# Patient Record
Sex: Female | Born: 1960 | Race: White | Hispanic: No | Marital: Married | Smoking: Former smoker
Health system: Southern US, Community
[De-identification: ages and names within clinical notes are randomized; demographics above are authoritative.]

## PROBLEM LIST (undated history)

## (undated) DIAGNOSIS — E538 Deficiency of other specified B group vitamins: Secondary | ICD-10-CM

## (undated) DIAGNOSIS — M25569 Pain in unspecified knee: Secondary | ICD-10-CM

## (undated) DIAGNOSIS — K219 Gastro-esophageal reflux disease without esophagitis: Secondary | ICD-10-CM

## (undated) DIAGNOSIS — M2550 Pain in unspecified joint: Secondary | ICD-10-CM

## (undated) DIAGNOSIS — C50919 Malignant neoplasm of unspecified site of unspecified female breast: Secondary | ICD-10-CM

## (undated) DIAGNOSIS — R5383 Other fatigue: Secondary | ICD-10-CM

## (undated) DIAGNOSIS — F329 Major depressive disorder, single episode, unspecified: Secondary | ICD-10-CM

## (undated) DIAGNOSIS — G43909 Migraine, unspecified, not intractable, without status migrainosus: Secondary | ICD-10-CM

## (undated) DIAGNOSIS — F419 Anxiety disorder, unspecified: Secondary | ICD-10-CM

## (undated) DIAGNOSIS — IMO0001 Reserved for inherently not codable concepts without codable children: Secondary | ICD-10-CM

## (undated) DIAGNOSIS — E039 Hypothyroidism, unspecified: Secondary | ICD-10-CM

## (undated) DIAGNOSIS — E079 Disorder of thyroid, unspecified: Secondary | ICD-10-CM

## (undated) DIAGNOSIS — Z8719 Personal history of other diseases of the digestive system: Secondary | ICD-10-CM

## (undated) DIAGNOSIS — M62830 Muscle spasm of back: Secondary | ICD-10-CM

## (undated) DIAGNOSIS — M545 Low back pain, unspecified: Secondary | ICD-10-CM

## (undated) DIAGNOSIS — D229 Melanocytic nevi, unspecified: Secondary | ICD-10-CM

## (undated) DIAGNOSIS — T148XXA Other injury of unspecified body region, initial encounter: Secondary | ICD-10-CM

## (undated) DIAGNOSIS — Z973 Presence of spectacles and contact lenses: Secondary | ICD-10-CM

## (undated) DIAGNOSIS — R3989 Other symptoms and signs involving the genitourinary system: Secondary | ICD-10-CM

## (undated) DIAGNOSIS — D649 Anemia, unspecified: Secondary | ICD-10-CM

## (undated) DIAGNOSIS — K121 Other forms of stomatitis: Secondary | ICD-10-CM

## (undated) DIAGNOSIS — F900 Attention-deficit hyperactivity disorder, predominantly inattentive type: Secondary | ICD-10-CM

## (undated) DIAGNOSIS — E876 Hypokalemia: Secondary | ICD-10-CM

## (undated) DIAGNOSIS — K5090 Crohn's disease, unspecified, without complications: Secondary | ICD-10-CM

## (undated) HISTORY — DX: Melanocytic nevi, unspecified: D22.9

## (undated) HISTORY — DX: Anemia, unspecified: D64.9

## (undated) HISTORY — DX: Malignant neoplasm of unspecified site of unspecified female breast: C50.919

## (undated) HISTORY — DX: Low back pain, unspecified: M54.50

## (undated) HISTORY — DX: Pain in unspecified knee: M25.569

## (undated) HISTORY — DX: Low back pain: M54.5

## (undated) HISTORY — DX: Muscle spasm of back: M62.830

## (undated) HISTORY — DX: Presence of spectacles and contact lenses: Z97.3

## (undated) HISTORY — DX: Reserved for inherently not codable concepts without codable children: IMO0001

## (undated) HISTORY — DX: Other fatigue: R53.83

## (undated) HISTORY — DX: Hypothyroidism, unspecified: E03.9

## (undated) HISTORY — DX: Hypokalemia: E87.6

## (undated) HISTORY — DX: Other forms of stomatitis: K12.1

## (undated) HISTORY — DX: Other symptoms and signs involving the genitourinary system: R39.89

## (undated) HISTORY — DX: Pain in unspecified joint: M25.50

## (undated) HISTORY — DX: Gastro-esophageal reflux disease without esophagitis: K21.9

## (undated) HISTORY — DX: Deficiency of other specified B group vitamins: E53.8

## (undated) HISTORY — DX: Attention-deficit hyperactivity disorder, predominantly inattentive type: F90.0

## (undated) HISTORY — DX: Other injury of unspecified body region, initial encounter: T14.8XXA

## (undated) HISTORY — DX: Disorder of thyroid, unspecified: E07.9

## (undated) HISTORY — DX: Crohn's disease, unspecified, without complications: K50.90

## (undated) HISTORY — DX: Migraine, unspecified, not intractable, without status migrainosus: G43.909

## (undated) HISTORY — DX: Anxiety disorder, unspecified: F41.9

## (undated) HISTORY — DX: Personal history of other diseases of the digestive system: Z87.19

## (undated) HISTORY — PX: COLON RESECTION: SHX5231

## (undated) HISTORY — DX: Major depressive disorder, single episode, unspecified: F32.9

---

## 1997-04-30 ENCOUNTER — Ambulatory Visit (HOSPITAL_COMMUNITY): Admission: RE | Admit: 1997-04-30 | Discharge: 1997-04-30 | Payer: Self-pay | Admitting: Obstetrics and Gynecology

## 1997-06-06 ENCOUNTER — Inpatient Hospital Stay (HOSPITAL_COMMUNITY): Admission: RE | Admit: 1997-06-06 | Discharge: 1997-06-09 | Payer: Self-pay | Admitting: Surgery

## 1997-10-27 ENCOUNTER — Inpatient Hospital Stay (HOSPITAL_COMMUNITY): Admission: RE | Admit: 1997-10-27 | Discharge: 1997-10-30 | Payer: Self-pay | Admitting: Surgery

## 1997-11-11 ENCOUNTER — Ambulatory Visit: Admission: RE | Admit: 1997-11-11 | Discharge: 1997-11-11 | Payer: Self-pay | Admitting: Gynecology

## 1997-12-02 ENCOUNTER — Encounter (HOSPITAL_COMMUNITY): Admission: RE | Admit: 1997-12-02 | Discharge: 1998-03-02 | Payer: Self-pay | Admitting: Gastroenterology

## 1998-01-21 ENCOUNTER — Ambulatory Visit: Admission: RE | Admit: 1998-01-21 | Discharge: 1998-01-21 | Payer: Self-pay | Admitting: Gynecology

## 1998-01-23 ENCOUNTER — Ambulatory Visit (HOSPITAL_COMMUNITY): Admission: RE | Admit: 1998-01-23 | Discharge: 1998-01-23 | Payer: Self-pay | Admitting: Gastroenterology

## 1998-01-27 ENCOUNTER — Observation Stay (HOSPITAL_COMMUNITY): Admission: RE | Admit: 1998-01-27 | Discharge: 1998-01-28 | Payer: Self-pay | Admitting: Gynecology

## 1998-01-27 ENCOUNTER — Encounter: Payer: Self-pay | Admitting: Gynecology

## 1998-02-10 ENCOUNTER — Ambulatory Visit: Admission: RE | Admit: 1998-02-10 | Discharge: 1998-02-10 | Payer: Self-pay | Admitting: Gynecology

## 1998-03-04 ENCOUNTER — Ambulatory Visit: Admission: RE | Admit: 1998-03-04 | Discharge: 1998-03-04 | Payer: Self-pay | Admitting: Gynecology

## 1998-03-31 ENCOUNTER — Ambulatory Visit: Admission: RE | Admit: 1998-03-31 | Discharge: 1998-03-31 | Payer: Self-pay | Admitting: Gynecology

## 1998-06-03 ENCOUNTER — Ambulatory Visit: Admission: RE | Admit: 1998-06-03 | Discharge: 1998-06-03 | Payer: Self-pay | Admitting: Gynecology

## 1998-11-04 ENCOUNTER — Ambulatory Visit: Admission: RE | Admit: 1998-11-04 | Discharge: 1998-11-04 | Payer: Self-pay | Admitting: Gynecology

## 1998-11-05 ENCOUNTER — Other Ambulatory Visit: Admission: RE | Admit: 1998-11-05 | Discharge: 1998-11-05 | Payer: Self-pay | Admitting: Gynecology

## 1999-04-06 ENCOUNTER — Ambulatory Visit: Admission: RE | Admit: 1999-04-06 | Discharge: 1999-04-06 | Payer: Self-pay | Admitting: Gynecology

## 2000-08-22 ENCOUNTER — Ambulatory Visit: Admission: RE | Admit: 2000-08-22 | Discharge: 2000-08-22 | Payer: Self-pay | Admitting: Gynecology

## 2000-09-19 ENCOUNTER — Ambulatory Visit (HOSPITAL_COMMUNITY): Admission: RE | Admit: 2000-09-19 | Discharge: 2000-09-19 | Payer: Self-pay | Admitting: Gynecology

## 2000-09-19 ENCOUNTER — Encounter: Payer: Self-pay | Admitting: Gynecology

## 2000-10-04 ENCOUNTER — Encounter (HOSPITAL_COMMUNITY): Admission: RE | Admit: 2000-10-04 | Discharge: 2001-01-02 | Payer: Self-pay | Admitting: Gastroenterology

## 2001-01-16 ENCOUNTER — Encounter: Payer: Self-pay | Admitting: Gastroenterology

## 2001-01-16 ENCOUNTER — Encounter: Admission: RE | Admit: 2001-01-16 | Discharge: 2001-01-16 | Payer: Self-pay | Admitting: Gastroenterology

## 2001-03-01 ENCOUNTER — Ambulatory Visit (HOSPITAL_COMMUNITY): Admission: RE | Admit: 2001-03-01 | Discharge: 2001-03-01 | Payer: Self-pay | Admitting: Gastroenterology

## 2001-03-01 ENCOUNTER — Encounter (INDEPENDENT_AMBULATORY_CARE_PROVIDER_SITE_OTHER): Payer: Self-pay

## 2001-03-07 ENCOUNTER — Ambulatory Visit: Admission: RE | Admit: 2001-03-07 | Discharge: 2001-03-07 | Payer: Self-pay | Admitting: Gynecology

## 2001-05-30 ENCOUNTER — Encounter: Admission: RE | Admit: 2001-05-30 | Discharge: 2001-05-30 | Payer: Self-pay | Admitting: Obstetrics and Gynecology

## 2001-05-30 ENCOUNTER — Encounter: Payer: Self-pay | Admitting: Obstetrics and Gynecology

## 2003-09-03 ENCOUNTER — Ambulatory Visit (HOSPITAL_COMMUNITY): Admission: RE | Admit: 2003-09-03 | Discharge: 2003-09-03 | Payer: Self-pay | Admitting: Gastroenterology

## 2003-09-03 ENCOUNTER — Encounter (INDEPENDENT_AMBULATORY_CARE_PROVIDER_SITE_OTHER): Payer: Self-pay | Admitting: Specialist

## 2003-09-24 ENCOUNTER — Encounter: Admission: RE | Admit: 2003-09-24 | Discharge: 2003-09-24 | Payer: Self-pay | Admitting: Gastroenterology

## 2004-08-31 ENCOUNTER — Other Ambulatory Visit: Admission: RE | Admit: 2004-08-31 | Discharge: 2004-08-31 | Payer: Self-pay | Admitting: Obstetrics and Gynecology

## 2004-10-11 ENCOUNTER — Encounter: Admission: RE | Admit: 2004-10-11 | Discharge: 2004-10-11 | Payer: Self-pay | Admitting: Gastroenterology

## 2005-06-17 ENCOUNTER — Encounter: Admission: RE | Admit: 2005-06-17 | Discharge: 2005-06-17 | Payer: Self-pay | Admitting: Gastroenterology

## 2005-07-01 ENCOUNTER — Encounter (HOSPITAL_COMMUNITY): Admission: RE | Admit: 2005-07-01 | Discharge: 2005-09-29 | Payer: Self-pay | Admitting: Gastroenterology

## 2005-08-12 ENCOUNTER — Encounter: Admission: RE | Admit: 2005-08-12 | Discharge: 2005-08-12 | Payer: Self-pay | Admitting: Gastroenterology

## 2005-10-07 ENCOUNTER — Encounter: Admission: RE | Admit: 2005-10-07 | Discharge: 2005-10-07 | Payer: Self-pay | Admitting: Gastroenterology

## 2008-06-05 ENCOUNTER — Encounter: Admission: RE | Admit: 2008-06-05 | Discharge: 2008-06-05 | Payer: Self-pay | Admitting: Family Medicine

## 2008-07-09 ENCOUNTER — Inpatient Hospital Stay (HOSPITAL_COMMUNITY): Admission: EM | Admit: 2008-07-09 | Discharge: 2008-07-12 | Payer: Self-pay | Admitting: Emergency Medicine

## 2008-07-21 ENCOUNTER — Ambulatory Visit (HOSPITAL_COMMUNITY): Admission: RE | Admit: 2008-07-21 | Discharge: 2008-07-21 | Payer: Self-pay | Admitting: Neurosurgery

## 2008-08-22 ENCOUNTER — Encounter: Admission: RE | Admit: 2008-08-22 | Discharge: 2008-08-22 | Payer: Self-pay | Admitting: Family Medicine

## 2009-04-29 ENCOUNTER — Ambulatory Visit: Admission: RE | Admit: 2009-04-29 | Discharge: 2009-04-29 | Payer: Self-pay | Admitting: Gynecology

## 2009-05-12 ENCOUNTER — Ambulatory Visit (HOSPITAL_COMMUNITY): Admission: RE | Admit: 2009-05-12 | Discharge: 2009-05-12 | Payer: Self-pay | Admitting: Gynecology

## 2009-05-26 ENCOUNTER — Encounter: Admission: RE | Admit: 2009-05-26 | Discharge: 2009-05-26 | Payer: Self-pay | Admitting: Gynecology

## 2009-07-03 ENCOUNTER — Ambulatory Visit: Admission: RE | Admit: 2009-07-03 | Discharge: 2009-07-03 | Payer: Self-pay | Admitting: Gynecology

## 2009-08-04 ENCOUNTER — Ambulatory Visit (HOSPITAL_COMMUNITY): Admission: RE | Admit: 2009-08-04 | Discharge: 2009-08-04 | Payer: Self-pay | Admitting: Gynecology

## 2009-08-11 ENCOUNTER — Ambulatory Visit: Admission: RE | Admit: 2009-08-11 | Discharge: 2009-08-11 | Payer: Self-pay | Admitting: Gynecology

## 2009-08-18 ENCOUNTER — Inpatient Hospital Stay (HOSPITAL_COMMUNITY): Admission: RE | Admit: 2009-08-18 | Discharge: 2009-08-23 | Payer: Self-pay | Admitting: Obstetrics & Gynecology

## 2009-08-26 ENCOUNTER — Ambulatory Visit: Admission: RE | Admit: 2009-08-26 | Discharge: 2009-08-26 | Payer: Self-pay | Admitting: Gynecology

## 2009-09-11 ENCOUNTER — Ambulatory Visit: Admission: RE | Admit: 2009-09-11 | Discharge: 2009-09-11 | Payer: Self-pay | Admitting: Gynecology

## 2010-01-01 ENCOUNTER — Encounter: Admission: RE | Admit: 2010-01-01 | Discharge: 2010-01-01 | Payer: Self-pay | Admitting: Obstetrics and Gynecology

## 2010-01-04 ENCOUNTER — Encounter: Admission: RE | Admit: 2010-01-04 | Discharge: 2010-01-04 | Payer: Self-pay | Admitting: Obstetrics and Gynecology

## 2010-01-05 ENCOUNTER — Ambulatory Visit: Payer: Self-pay | Admitting: Oncology

## 2010-01-06 ENCOUNTER — Ambulatory Visit: Payer: Self-pay | Admitting: Psychiatry

## 2010-01-06 LAB — CBC WITH DIFFERENTIAL/PLATELET
BASO%: 0.6 % (ref 0.0–2.0)
EOS%: 2.3 % (ref 0.0–7.0)
HCT: 36.8 % (ref 34.8–46.6)
LYMPH%: 18.5 % (ref 14.0–49.7)
MCH: 31.9 pg (ref 25.1–34.0)
MCHC: 33.6 g/dL (ref 31.5–36.0)
NEUT%: 72.5 % (ref 38.4–76.8)
RBC: 3.88 10*6/uL (ref 3.70–5.45)
lymph#: 1.4 10*3/uL (ref 0.9–3.3)

## 2010-01-06 LAB — COMPREHENSIVE METABOLIC PANEL
ALT: 14 U/L (ref 0–35)
AST: 16 U/L (ref 0–37)
Chloride: 109 mEq/L (ref 96–112)
Creatinine, Ser: 0.82 mg/dL (ref 0.40–1.20)
Sodium: 141 mEq/L (ref 135–145)
Total Bilirubin: 0.5 mg/dL (ref 0.3–1.2)
Total Protein: 5.7 g/dL — ABNORMAL LOW (ref 6.0–8.3)

## 2010-01-09 ENCOUNTER — Encounter: Admission: RE | Admit: 2010-01-09 | Discharge: 2010-01-09 | Payer: Self-pay | Admitting: Obstetrics and Gynecology

## 2010-01-12 ENCOUNTER — Ambulatory Visit
Admission: RE | Admit: 2010-01-12 | Discharge: 2010-02-10 | Payer: Self-pay | Source: Home / Self Care | Attending: Radiation Oncology | Admitting: Radiation Oncology

## 2010-01-12 ENCOUNTER — Ambulatory Visit (HOSPITAL_COMMUNITY)
Admission: RE | Admit: 2010-01-12 | Discharge: 2010-01-12 | Payer: Self-pay | Source: Home / Self Care | Admitting: Oncology

## 2010-01-13 ENCOUNTER — Ambulatory Visit: Payer: Self-pay | Admitting: Psychiatry

## 2010-01-21 ENCOUNTER — Ambulatory Visit: Payer: Self-pay | Admitting: Psychiatry

## 2010-01-26 ENCOUNTER — Ambulatory Visit (HOSPITAL_COMMUNITY)
Admission: RE | Admit: 2010-01-26 | Discharge: 2010-01-26 | Payer: Self-pay | Source: Home / Self Care | Attending: General Surgery | Admitting: General Surgery

## 2010-01-27 ENCOUNTER — Ambulatory Visit: Payer: Self-pay | Admitting: Psychiatry

## 2010-01-27 ENCOUNTER — Ambulatory Visit (HOSPITAL_COMMUNITY)
Admission: RE | Admit: 2010-01-27 | Discharge: 2010-01-27 | Payer: Self-pay | Source: Home / Self Care | Attending: Oncology | Admitting: Oncology

## 2010-01-27 ENCOUNTER — Encounter: Payer: Self-pay | Admitting: Oncology

## 2010-01-28 LAB — CBC WITH DIFFERENTIAL/PLATELET
BASO%: 0.3 % (ref 0.0–2.0)
HCT: 37.5 % (ref 34.8–46.6)
LYMPH%: 28.3 % (ref 14.0–49.7)
MCH: 32.4 pg (ref 25.1–34.0)
MCHC: 33.9 g/dL (ref 31.5–36.0)
MCV: 95.5 fL (ref 79.5–101.0)
MONO#: 0.4 10*3/uL (ref 0.1–0.9)
MONO%: 9.2 % (ref 0.0–14.0)
NEUT%: 60.1 % (ref 38.4–76.8)
Platelets: 182 10*3/uL (ref 145–400)
RBC: 3.93 10*6/uL (ref 3.70–5.45)
WBC: 4.3 10*3/uL (ref 3.9–10.3)

## 2010-01-28 LAB — COMPREHENSIVE METABOLIC PANEL
ALT: 29 U/L (ref 0–35)
AST: 25 U/L (ref 0–37)
Alkaline Phosphatase: 74 U/L (ref 39–117)
Creatinine, Ser: 0.59 mg/dL (ref 0.40–1.20)
Sodium: 142 mEq/L (ref 135–145)
Total Bilirubin: 0.2 mg/dL — ABNORMAL LOW (ref 0.3–1.2)
Total Protein: 6.1 g/dL (ref 6.0–8.3)

## 2010-02-03 ENCOUNTER — Ambulatory Visit: Payer: Self-pay | Admitting: Psychiatry

## 2010-02-04 ENCOUNTER — Ambulatory Visit: Payer: Self-pay | Admitting: Oncology

## 2010-02-04 LAB — COMPREHENSIVE METABOLIC PANEL
AST: 41 U/L — ABNORMAL HIGH (ref 0–37)
Albumin: 4.3 g/dL (ref 3.5–5.2)
Alkaline Phosphatase: 84 U/L (ref 39–117)
Potassium: 3.6 mEq/L (ref 3.5–5.3)
Sodium: 140 mEq/L (ref 135–145)
Total Bilirubin: 0.5 mg/dL (ref 0.3–1.2)
Total Protein: 7 g/dL (ref 6.0–8.3)

## 2010-02-04 LAB — CBC WITH DIFFERENTIAL/PLATELET
BASO%: 0.5 % (ref 0.0–2.0)
EOS%: 0.4 % (ref 0.0–7.0)
MCH: 31.7 pg (ref 25.1–34.0)
MCHC: 34.3 g/dL (ref 31.5–36.0)
MCV: 92.6 fL (ref 79.5–101.0)
MONO%: 4.8 % (ref 0.0–14.0)
RBC: 3.78 10*6/uL (ref 3.70–5.45)
RDW: 13 % (ref 11.2–14.5)
lymph#: 1.2 10*3/uL (ref 0.9–3.3)

## 2010-02-11 LAB — BASIC METABOLIC PANEL
BUN: 18 mg/dL (ref 6–23)
Chloride: 106 mEq/L (ref 96–112)
Creatinine, Ser: 0.96 mg/dL (ref 0.40–1.20)
Glucose, Bld: 98 mg/dL (ref 70–99)
Potassium: 3.2 mEq/L — ABNORMAL LOW (ref 3.5–5.3)

## 2010-02-11 LAB — CBC WITH DIFFERENTIAL/PLATELET
BASO%: 0.6 % (ref 0.0–2.0)
Eosinophils Absolute: 0 10*3/uL (ref 0.0–0.5)
LYMPH%: 29.7 % (ref 14.0–49.7)
MCHC: 34.1 g/dL (ref 31.5–36.0)
MCV: 91.3 fL (ref 79.5–101.0)
MONO%: 15.9 % — ABNORMAL HIGH (ref 0.0–14.0)
NEUT#: 1.8 10*3/uL (ref 1.5–6.5)
Platelets: 237 10*3/uL (ref 145–400)
RBC: 3.69 10*6/uL — ABNORMAL LOW (ref 3.70–5.45)
RDW: 12.9 % (ref 11.2–14.5)
WBC: 3.3 10*3/uL — ABNORMAL LOW (ref 3.9–10.3)
nRBC: 0 % (ref 0–0)

## 2010-02-14 HISTORY — PX: MASTECTOMY: SHX3

## 2010-02-17 ENCOUNTER — Inpatient Hospital Stay (HOSPITAL_COMMUNITY)
Admission: EM | Admit: 2010-02-17 | Discharge: 2010-02-20 | Payer: Self-pay | Source: Home / Self Care | Attending: Internal Medicine | Admitting: Internal Medicine

## 2010-02-17 LAB — COMPREHENSIVE METABOLIC PANEL
ALT: 18 U/L (ref 0–35)
AST: 16 U/L (ref 0–37)
Albumin: 3.8 g/dL (ref 3.5–5.2)
Alkaline Phosphatase: 82 U/L (ref 39–117)
BUN: 31 mg/dL — ABNORMAL HIGH (ref 6–23)
CO2: 22 mEq/L (ref 19–32)
Calcium: 9.3 mg/dL (ref 8.4–10.5)
Chloride: 98 mEq/L (ref 96–112)
Creatinine, Ser: 2.04 mg/dL — ABNORMAL HIGH (ref 0.4–1.2)
GFR calc Af Amer: 32 mL/min — ABNORMAL LOW (ref >60)
GFR calc non Af Amer: 26 mL/min — ABNORMAL LOW (ref >60)
Glucose, Bld: 122 mg/dL — ABNORMAL HIGH (ref 70–99)
Potassium: 3 mEq/L — ABNORMAL LOW (ref 3.5–5.1)
Sodium: 136 mEq/L (ref 135–145)
Total Bilirubin: 1.2 mg/dL (ref 0.3–1.2)
Total Protein: 7.8 g/dL (ref 6.0–8.3)

## 2010-02-17 LAB — LACTIC ACID, PLASMA: Lactic Acid, Venous: 1.4 mmol/L (ref 0.5–2.2)

## 2010-02-17 LAB — URINALYSIS, ROUTINE W REFLEX MICROSCOPIC
Hemoglobin, Urine: NEGATIVE
Nitrite: NEGATIVE
Protein, ur: 30 mg/dL — AB
Specific Gravity, Urine: 1.029 (ref 1.005–1.030)
Urine Glucose, Fasting: NEGATIVE mg/dL
Urobilinogen, UA: 0.2 mg/dL (ref 0.0–1.0)
pH: 6 (ref 5.0–8.0)

## 2010-02-17 LAB — LIPASE, BLOOD: Lipase: 17 U/L (ref 11–59)

## 2010-02-17 LAB — URINE MICROSCOPIC-ADD ON

## 2010-02-17 LAB — CBC
HCT: 40.7 % (ref 36.0–46.0)
Hemoglobin: 14.1 g/dL (ref 12.0–15.0)
MCH: 32.5 pg (ref 26.0–34.0)
MCHC: 34.6 g/dL (ref 30.0–36.0)
MCV: 93.8 fL (ref 78.0–100.0)
Platelets: 416 10*3/uL — ABNORMAL HIGH (ref 150–400)
RBC: 4.34 MIL/uL (ref 3.87–5.11)
RDW: 12.7 % (ref 11.5–15.5)
WBC: 7 10*3/uL (ref 4.0–10.5)

## 2010-02-17 LAB — DIFFERENTIAL
Basophils Absolute: 0.1 10*3/uL (ref 0.0–0.1)
Basophils Relative: 1 % (ref 0–1)
Eosinophils Absolute: 0 10*3/uL (ref 0.0–0.7)
Eosinophils Relative: 0 % (ref 0–5)
Lymphocytes Relative: 17 % (ref 12–46)
Lymphs Abs: 1.2 10*3/uL (ref 0.7–4.0)
Monocytes Absolute: 1.1 10*3/uL — ABNORMAL HIGH (ref 0.1–1.0)
Monocytes Relative: 15 % — ABNORMAL HIGH (ref 3–12)
Neutro Abs: 4.6 10*3/uL (ref 1.7–7.7)
Neutrophils Relative %: 67 % (ref 43–77)

## 2010-02-18 LAB — CBC
HCT: 31.6 % — ABNORMAL LOW (ref 36.0–46.0)
HCT: 31.3 % — ABNORMAL LOW (ref 36.0–46.0)
Hemoglobin: 10.5 g/dL — ABNORMAL LOW (ref 12.0–15.0)
Hemoglobin: 10.6 g/dL — ABNORMAL LOW (ref 12.0–15.0)
MCH: 31.4 pg (ref 26.0–34.0)
MCH: 31.8 pg (ref 26.0–34.0)
MCHC: 33.2 g/dL (ref 30.0–36.0)
MCHC: 33.9 g/dL (ref 30.0–36.0)
MCV: 94.6 fL (ref 78.0–100.0)
MCV: 94 fL (ref 78.0–100.0)
Platelets: 274 10*3/uL (ref 150–400)
Platelets: 282 10*3/uL (ref 150–400)
RBC: 3.34 MIL/uL — ABNORMAL LOW (ref 3.87–5.11)
RBC: 3.33 MIL/uL — ABNORMAL LOW (ref 3.87–5.11)
RDW: 12.7 % (ref 11.5–15.5)
RDW: 12.6 % (ref 11.5–15.5)
WBC: 4.9 10*3/uL (ref 4.0–10.5)
WBC: 4.3 10*3/uL (ref 4.0–10.5)

## 2010-02-18 LAB — BASIC METABOLIC PANEL
BUN: 26 mg/dL — ABNORMAL HIGH (ref 6–23)
CO2: 19 mEq/L (ref 19–32)
Calcium: 7.6 mg/dL — ABNORMAL LOW (ref 8.4–10.5)
Chloride: 109 mEq/L (ref 96–112)
Creatinine, Ser: 1.31 mg/dL — ABNORMAL HIGH (ref 0.4–1.2)
GFR calc Af Amer: 53 mL/min — ABNORMAL LOW (ref >60)
GFR calc non Af Amer: 44 mL/min — ABNORMAL LOW (ref >60)
Glucose, Bld: 75 mg/dL (ref 70–99)
Potassium: 3.8 mEq/L (ref 3.5–5.1)
Sodium: 135 mEq/L (ref 135–145)

## 2010-02-18 LAB — HEMOCCULT GUIAC POC 1CARD (OFFICE)
Fecal Occult Bld: NEGATIVE
Fecal Occult Bld: POSITIVE
Fecal Occult Bld: NEGATIVE

## 2010-02-18 LAB — MAGNESIUM: Magnesium: 1.4 mg/dL — ABNORMAL LOW (ref 1.5–2.5)

## 2010-02-19 LAB — CBC
HCT: 28.5 % — ABNORMAL LOW (ref 36.0–46.0)
Hemoglobin: 9.6 g/dL — ABNORMAL LOW (ref 12.0–15.0)
MCH: 31.8 pg (ref 26.0–34.0)
MCHC: 33.7 g/dL (ref 30.0–36.0)
MCV: 94.4 fL (ref 78.0–100.0)
Platelets: 240 10*3/uL (ref 150–400)
RBC: 3.02 MIL/uL — ABNORMAL LOW (ref 3.87–5.11)
RDW: 12.6 % (ref 11.5–15.5)
WBC: 4.5 10*3/uL (ref 4.0–10.5)

## 2010-02-19 LAB — PHOSPHORUS: Phosphorus: 2.2 mg/dL — ABNORMAL LOW (ref 2.3–4.6)

## 2010-02-19 LAB — VITAMIN B12: Vitamin B-12: 248 pg/mL (ref 211–911)

## 2010-02-19 LAB — BASIC METABOLIC PANEL
BUN: 12 mg/dL (ref 6–23)
CO2: 20 mEq/L (ref 19–32)
Calcium: 8.4 mg/dL (ref 8.4–10.5)
Chloride: 114 mEq/L — ABNORMAL HIGH (ref 96–112)
Creatinine, Ser: 0.94 mg/dL (ref 0.4–1.2)
GFR calc Af Amer: >60 mL/min (ref >60)
GFR calc non Af Amer: >60 mL/min (ref >60)
Glucose, Bld: 94 mg/dL (ref 70–99)
Potassium: 3.6 mEq/L (ref 3.5–5.1)
Sodium: 140 mEq/L (ref 135–145)

## 2010-02-19 LAB — URINE CULTURE
Colony Count: 15000
Culture  Setup Time: 201201050216

## 2010-02-19 LAB — DIFFERENTIAL
Basophils Absolute: 0 10*3/uL (ref 0.0–0.1)
Basophils Relative: 1 % (ref 0–1)
Eosinophils Absolute: 0 10*3/uL (ref 0.0–0.7)
Eosinophils Relative: 0 % (ref 0–5)
Lymphocytes Relative: 20 % (ref 12–46)
Lymphs Abs: 0.9 10*3/uL (ref 0.7–4.0)
Monocytes Absolute: 0.7 10*3/uL (ref 0.1–1.0)
Monocytes Relative: 14 % — ABNORMAL HIGH (ref 3–12)
Neutro Abs: 2.9 10*3/uL (ref 1.7–7.7)
Neutrophils Relative %: 65 % (ref 43–77)

## 2010-02-19 LAB — FOLATE: Folate: 18.6 ng/mL

## 2010-02-19 LAB — FERRITIN: Ferritin: 224 ng/mL (ref 10–291)

## 2010-02-19 LAB — IRON AND TIBC
Iron: 24 ug/dL — ABNORMAL LOW (ref 42–135)
Saturation Ratios: 11 % — ABNORMAL LOW (ref 20–55)
TIBC: 218 ug/dL — ABNORMAL LOW (ref 250–470)
UIBC: 194 ug/dL

## 2010-02-19 LAB — MAGNESIUM: Magnesium: 2.1 mg/dL (ref 1.5–2.5)

## 2010-02-24 ENCOUNTER — Ambulatory Visit
Admission: RE | Admit: 2010-02-24 | Discharge: 2010-02-24 | Payer: Self-pay | Source: Home / Self Care | Attending: Psychiatry | Admitting: Psychiatry

## 2010-02-25 LAB — CBC WITH DIFFERENTIAL/PLATELET
BASO%: 1 % (ref 0.0–2.0)
Basophils Absolute: 0.1 10*3/uL (ref 0.0–0.1)
EOS%: 0.3 % (ref 0.0–7.0)
Eosinophils Absolute: 0 10*3/uL (ref 0.0–0.5)
HCT: 29.7 % — ABNORMAL LOW (ref 34.8–46.6)
HGB: 10 g/dL — ABNORMAL LOW (ref 11.6–15.9)
LYMPH%: 26.3 % (ref 14.0–49.7)
MCH: 31.4 pg (ref 25.1–34.0)
MCHC: 33.7 g/dL (ref 31.5–36.0)
MCV: 93.4 fL (ref 79.5–101.0)
MONO#: 0.7 10*3/uL (ref 0.1–0.9)
MONO%: 9.8 % (ref 0.0–14.0)
NEUT#: 4.5 10*3/uL (ref 1.5–6.5)
NEUT%: 62.6 % (ref 38.4–76.8)
Platelets: 189 10*3/uL (ref 145–400)
RBC: 3.18 10*6/uL — ABNORMAL LOW (ref 3.70–5.45)
RDW: 14.5 % (ref 11.2–14.5)
WBC: 7.2 10*3/uL (ref 3.9–10.3)
lymph#: 1.9 10*3/uL (ref 0.9–3.3)
nRBC: 0 % (ref 0–0)

## 2010-02-25 LAB — COMPREHENSIVE METABOLIC PANEL
ALT: 15 U/L (ref 0–35)
AST: 14 U/L (ref 0–37)
Albumin: 3.5 g/dL (ref 3.5–5.2)
Alkaline Phosphatase: 60 U/L (ref 39–117)
BUN: 21 mg/dL (ref 6–23)
CO2: 22 mEq/L (ref 19–32)
Calcium: 7.7 mg/dL — ABNORMAL LOW (ref 8.4–10.5)
Chloride: 112 mEq/L (ref 96–112)
Creatinine, Ser: 0.96 mg/dL (ref 0.40–1.20)
Glucose, Bld: 90 mg/dL (ref 70–99)
Potassium: 3.3 mEq/L — ABNORMAL LOW (ref 3.5–5.3)
Sodium: 143 mEq/L (ref 135–145)
Total Bilirubin: 0.2 mg/dL — ABNORMAL LOW (ref 0.3–1.2)
Total Protein: 5.4 g/dL — ABNORMAL LOW (ref 6.0–8.3)

## 2010-03-01 LAB — CULTURE, BLOOD (ROUTINE X 2)
Culture  Setup Time: 201201050127
Culture  Setup Time: 201201050128
Culture: NO GROWTH
Culture: NO GROWTH

## 2010-03-04 ENCOUNTER — Inpatient Hospital Stay (HOSPITAL_COMMUNITY)
Admission: AD | Admit: 2010-03-04 | Discharge: 2010-03-13 | Payer: Self-pay | Source: Home / Self Care | Attending: Oncology | Admitting: Oncology

## 2010-03-04 LAB — CBC WITH DIFFERENTIAL/PLATELET
BASO%: 0.8 % (ref 0.0–2.0)
Basophils Absolute: 0 10*3/uL (ref 0.0–0.1)
EOS%: 2 % (ref 0.0–7.0)
Eosinophils Absolute: 0.1 10*3/uL (ref 0.0–0.5)
HCT: 40.6 % (ref 34.8–46.6)
HGB: 14 g/dL (ref 11.6–15.9)
LYMPH%: 31.8 % (ref 14.0–49.7)
MCH: 32.7 pg (ref 25.1–34.0)
MCHC: 34.5 g/dL (ref 31.5–36.0)
MCV: 94.9 fL (ref 79.5–101.0)
MONO#: 0.7 10*3/uL (ref 0.1–0.9)
MONO%: 12.7 % (ref 0.0–14.0)
NEUT#: 2.7 10*3/uL (ref 1.5–6.5)
NEUT%: 52.7 % (ref 38.4–76.8)
Platelets: 155 10*3/uL (ref 145–400)
RBC: 4.28 10*6/uL (ref 3.70–5.45)
RDW: 15.9 % — ABNORMAL HIGH (ref 11.2–14.5)
WBC: 5.1 10*3/uL (ref 3.9–10.3)
lymph#: 1.6 10*3/uL (ref 0.9–3.3)
nRBC: 0 % (ref 0–0)

## 2010-03-08 LAB — CLOSTRIDIUM DIFFICILE BY PCR: Toxigenic C. Difficile by PCR: NEGATIVE

## 2010-03-08 LAB — COMPREHENSIVE METABOLIC PANEL
ALT: 8 U/L (ref 0–35)
AST: 11 U/L (ref 0–37)
Albumin: 3.4 g/dL — ABNORMAL LOW (ref 3.5–5.2)
Alkaline Phosphatase: 64 U/L (ref 39–117)
BUN: 22 mg/dL (ref 6–23)
CO2: 21 mEq/L (ref 19–32)
Calcium: 8.4 mg/dL (ref 8.4–10.5)
Chloride: 113 mEq/L — ABNORMAL HIGH (ref 96–112)
Creatinine, Ser: 1.06 mg/dL (ref 0.4–1.2)
GFR calc non Af Amer: >60 mL/min (ref >60)
Glucose, Bld: 98 mg/dL (ref 70–99)
Sodium: 141 mEq/L (ref 135–145)
Total Bilirubin: 0.7 mg/dL (ref 0.3–1.2)
Total Bilirubin: 0.6 mg/dL (ref 0.3–1.2)
Total Protein: 6.6 g/dL (ref 6.0–8.3)

## 2010-03-08 LAB — DIFFERENTIAL
Basophils Absolute: 0 10*3/uL (ref 0.0–0.1)
Lymphocytes Relative: 28 % (ref 12–46)
Monocytes Absolute: 0.5 10*3/uL (ref 0.1–1.0)
Monocytes Relative: 16 % — ABNORMAL HIGH (ref 3–12)
Neutro Abs: 1.6 10*3/uL — ABNORMAL LOW (ref 1.7–7.7)
Neutrophils Relative %: 52 % (ref 43–77)

## 2010-03-08 LAB — CBC
HCT: 31.1 % — ABNORMAL LOW (ref 36.0–46.0)
Hemoglobin: 10.8 g/dL — ABNORMAL LOW (ref 12.0–15.0)
MCHC: 34.7 g/dL (ref 30.0–36.0)
RBC: 3.3 MIL/uL — ABNORMAL LOW (ref 3.87–5.11)

## 2010-03-09 ENCOUNTER — Ambulatory Visit (HOSPITAL_BASED_OUTPATIENT_CLINIC_OR_DEPARTMENT_OTHER): Payer: Managed Care, Other (non HMO) | Admitting: Oncology

## 2010-03-09 LAB — BASIC METABOLIC PANEL
BUN: 5 mg/dL — ABNORMAL LOW (ref 6–23)
CO2: 20 mEq/L (ref 19–32)
Chloride: 117 mEq/L — ABNORMAL HIGH (ref 96–112)
Chloride: 119 mEq/L — ABNORMAL HIGH (ref 96–112)
GFR calc Af Amer: >60 mL/min (ref >60)
GFR calc Af Amer: >60 mL/min (ref >60)
GFR calc non Af Amer: >60 mL/min (ref >60)
Potassium: 3.8 mEq/L (ref 3.5–5.1)
Potassium: 4.2 mEq/L (ref 3.5–5.1)

## 2010-03-09 LAB — DIFFERENTIAL
Basophils Absolute: 0 10*3/uL (ref 0.0–0.1)
Basophils Relative: 0 % (ref 0–1)
Basophils Relative: 1 % (ref 0–1)
Eosinophils Absolute: 0.1 10*3/uL (ref 0.0–0.7)
Eosinophils Absolute: 0.1 10*3/uL (ref 0.0–0.7)
Eosinophils Absolute: 0.3 10*3/uL (ref 0.0–0.7)
Eosinophils Relative: 3 % (ref 0–5)
Lymphocytes Relative: 22 % (ref 12–46)
Lymphs Abs: 1 10*3/uL (ref 0.7–4.0)
Lymphs Abs: 0.9 10*3/uL (ref 0.7–4.0)
Monocytes Relative: 7 % (ref 3–12)
Monocytes Relative: 7 % (ref 3–12)
Neutro Abs: 1.9 10*3/uL (ref 1.7–7.7)
Neutro Abs: 2.8 10*3/uL (ref 1.7–7.7)
Neutrophils Relative %: 66 % (ref 43–77)
Neutrophils Relative %: 57 % (ref 43–77)
Neutrophils Relative %: 65 % (ref 43–77)

## 2010-03-09 LAB — FERRITIN: Ferritin: 95 ng/mL (ref 10–291)

## 2010-03-09 LAB — CBC
HCT: 28.7 % — ABNORMAL LOW (ref 36.0–46.0)
Hemoglobin: 9.6 g/dL — ABNORMAL LOW (ref 12.0–15.0)
Hemoglobin: 9.7 g/dL — ABNORMAL LOW (ref 12.0–15.0)
MCH: 32.3 pg (ref 26.0–34.0)
MCV: 96.6 fL (ref 78.0–100.0)
Platelets: 125 10*3/uL — ABNORMAL LOW (ref 150–400)
Platelets: 128 10*3/uL — ABNORMAL LOW (ref 150–400)
Platelets: 135 10*3/uL — ABNORMAL LOW (ref 150–400)
RBC: 3.06 MIL/uL — ABNORMAL LOW (ref 3.87–5.11)
RBC: 2.97 MIL/uL — ABNORMAL LOW (ref 3.87–5.11)
RBC: 2.97 MIL/uL — ABNORMAL LOW (ref 3.87–5.11)
RDW: 15.7 % — ABNORMAL HIGH (ref 11.5–15.5)
WBC: 3.8 10*3/uL — ABNORMAL LOW (ref 4.0–10.5)
WBC: 3.3 10*3/uL — ABNORMAL LOW (ref 4.0–10.5)
WBC: 4.3 10*3/uL (ref 4.0–10.5)

## 2010-03-09 LAB — OVA AND PARASITE EXAMINATION

## 2010-03-09 LAB — TSH: TSH: 0.494 u[IU]/mL (ref 0.350–4.500)

## 2010-03-09 LAB — IRON AND TIBC
Iron: 95 ug/dL (ref 42–135)
Saturation Ratios: 45 % (ref 20–55)
TIBC: 213 ug/dL — ABNORMAL LOW (ref 250–470)
UIBC: 118 ug/dL

## 2010-03-10 LAB — CBC
HCT: 31.8 % — ABNORMAL LOW (ref 36.0–46.0)
Hemoglobin: 10.7 g/dL — ABNORMAL LOW (ref 12.0–15.0)
Hemoglobin: 11.1 g/dL — ABNORMAL LOW (ref 12.0–15.0)
MCH: 32.4 pg (ref 26.0–34.0)
MCV: 96.1 fL (ref 78.0–100.0)
Platelets: 211 10*3/uL (ref 150–400)
RBC: 3.43 MIL/uL — ABNORMAL LOW (ref 3.87–5.11)
RDW: 15.5 % (ref 11.5–15.5)
WBC: 3.8 10*3/uL — ABNORMAL LOW (ref 4.0–10.5)

## 2010-03-10 LAB — DIFFERENTIAL
Basophils Absolute: 0 10*3/uL (ref 0.0–0.1)
Basophils Absolute: 0 10*3/uL (ref 0.0–0.1)
Basophils Relative: 0 % (ref 0–1)
Eosinophils Absolute: 0.2 10*3/uL (ref 0.0–0.7)
Eosinophils Relative: 7 % — ABNORMAL HIGH (ref 0–5)
Lymphocytes Relative: 26 % (ref 12–46)
Lymphs Abs: 1 10*3/uL (ref 0.7–4.0)
Monocytes Relative: 7 % (ref 3–12)
Neutro Abs: 2.2 10*3/uL (ref 1.7–7.7)
Neutro Abs: 3.6 10*3/uL (ref 1.7–7.7)
Neutrophils Relative %: 70 % (ref 43–77)

## 2010-03-10 LAB — COMPREHENSIVE METABOLIC PANEL
ALT: 12 U/L (ref 0–35)
Alkaline Phosphatase: 51 U/L (ref 39–117)
BUN: 6 mg/dL (ref 6–23)
CO2: 25 mEq/L (ref 19–32)
Chloride: 115 mEq/L — ABNORMAL HIGH (ref 96–112)
GFR calc non Af Amer: >60 mL/min (ref >60)
Glucose, Bld: 96 mg/dL (ref 70–99)
Potassium: 4.4 mEq/L (ref 3.5–5.1)
Sodium: 146 mEq/L — ABNORMAL HIGH (ref 135–145)
Total Bilirubin: 0.5 mg/dL (ref 0.3–1.2)
Total Protein: 5.8 g/dL — ABNORMAL LOW (ref 6.0–8.3)

## 2010-03-10 LAB — CULTURE, BLOOD (ROUTINE X 2)
Culture  Setup Time: 201201192318
Culture: NO GROWTH
Culture: NO GROWTH

## 2010-03-10 LAB — STOOL CULTURE

## 2010-03-11 LAB — URINALYSIS, ROUTINE W REFLEX MICROSCOPIC
Nitrite: NEGATIVE
Specific Gravity, Urine: 1.021 (ref 1.005–1.030)
Urine Glucose, Fasting: NEGATIVE mg/dL
pH: 6 (ref 5.0–8.0)

## 2010-03-11 LAB — DIFFERENTIAL
Basophils Relative: 0 % (ref 0–1)
Eosinophils Absolute: 0 10*3/uL (ref 0.0–0.7)
Lymphs Abs: 0.4 10*3/uL — ABNORMAL LOW (ref 0.7–4.0)
Monocytes Relative: 1 % — ABNORMAL LOW (ref 3–12)
Neutro Abs: 3.4 10*3/uL (ref 1.7–7.7)
Neutrophils Relative %: 89 % — ABNORMAL HIGH (ref 43–77)

## 2010-03-11 LAB — CBC
Hemoglobin: 9.9 g/dL — ABNORMAL LOW (ref 12.0–15.0)
MCV: 97.7 fL (ref 78.0–100.0)
Platelets: 196 10*3/uL (ref 150–400)
RBC: 3.07 MIL/uL — ABNORMAL LOW (ref 3.87–5.11)
WBC: 3.8 10*3/uL — ABNORMAL LOW (ref 4.0–10.5)

## 2010-03-11 NOTE — Discharge Summary (Addendum)
[name], WHAN NO.:  [id number]  MEDICAL RECORD NO.:  [id number]          PATIENT TYPE:  INP  LOCATION:  1519                         FACILITY:  [facility]  PHYSICIAN:  Altha Harm, MDDATE OF BIRTH:  16-May-1960  DATE OF ADMISSION:  02/17/2010 DATE OF DISCHARGE:  02/20/2010                              DISCHARGE SUMMARY   DISCHARGE DISPOSITION:  Home.  FINAL DISCHARGE DIAGNOSES: 1. Dehydration, resolved. 2. Vomiting and diarrhea, resolved. 3. Viral syndrome, resolved. 4. Urinary tract infection, Escherichia coli. 5. Hypothyroidism. 6. Migraines. 7. Breast cancer, presently on preop chemotherapy. 8. Crohn disease. 9. 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 10.Breast cancer. 11.Depression and anxiety.  DISCHARGE MEDICATIONS: 1. Keflex 500 mg p.o. q.12 h. for 5 days. 2. Imodium 4 mg by mouth every 6 hours as needed. 3. Pentasa 1000 mg p.o. q.i.d. 4. Bupropion SR 150 mg p.o. t.i.d. 5. Excedrin 2 tablets p.o. q.8 h. p.r.n. headache. 6. Synthroid 200 mcg p.o. q.h.s. 7. Remeron 30 mg p.o. q.h.s. 8. Zofran 8 mg p.o. q.8 h. after chemo as needed. 9. Compazine 10 mg p.o. q.6 h. p.r.n. nausea. 10.Topamax 100 mg p.o. daily.  Please also note that the patient is on a chemotherapy regimen under the care of Dr. Drue Second and along with that she receives __________ prior to her chemotherapy.  CONSULTANT:  Petra Kuba, MD, Gastroenterology.  PROCEDURES:  None.  DIAGNOSTIC STUDIES:  Acute abdominal series done on January 4th, which shows no active cardiopulmonary disease.  No acute or specific abdominal findings.  There are postoperative changes in the right abdomen.  PRIMARY CARE PHYSICIAN:  Leah Gins, MD  GASTROENTEROLOGIST:  Petra Kuba, MD  GYNECOLOGIST:  Dineen Kid. Rana Snare, MD  ONCOLOGIST:  Drue Second, MD  CODE STATUS:  Full code.  ALLERGIES:  No known drug allergies.  CHIEF COMPLAINT:  Vomiting and diarrhea  over the past 3 days.  HISTORY OF PRESENT ILLNESS:  Please refer to the H and P by Dr. Orvan Falconer for details of the HPI.  HOSPITAL COURSE: 1. Vomiting and diarrhea.  The patient was mildly dehydrated as a     result of her vomiting and diarrhea.  She was admitted and her oral     medications held.  The patient was ruled out for C diff with C diff     PCR.  Her urine was also evaluated and she was found to have a     urinary tract infection which is likely the cause of the vomiting     and diarrhea.  The patient was given supportive care and her diet     was advanced to a regular diet, which she tolerated without     difficulty.  The patient was restarted on her Pentasa.  She was seen by gastroenterologist who agreed with the management and asked the patient to follow up in the gastroenterological office in the next few weeks. 1. Urinary tract infection.  The patient was diagnosed with an E coli     urinary tract infection.  She was started initially on Rocephin  while she was on an n.p.o. status.  She was then transitioned over     to Keflex and will continue 7 days of Keflex as an outpatient. 2. In terms of her other medical problems, she was continued on her     usual medication without sequela.  At the time of discharge, the     patient was tolerating her diet without difficulty. 3. In terms of her breast cancer, all chemotherapy was discontinued     due to her acute illness.  Dr. Drue Second was consulted over the     phone and the patient will follow up with Dr. Welton Flakes in the     outpatient to resume her presurgical chemotherapy.  At the time of discharge, the patient's condition is stable.  Please refer to the progress note on the day of discharge for details of the HPI.  DIETARY RESTRICTIONS:  None.  PHYSICAL RESTRICTIONS:  None.  FOLLOWUP:  The patient will follow up with Dr. Ewing Schlein in 2 weeks and with Dr. Drue Second and an appointment scheduled on February 25, 2010.     Altha Harm, MD     MAM/MEDQ  D:  03/04/2010  T:  03/04/2010  Job:  [id number]  Electronically Signed by Marthann Schiller MD on 03/11/2010 11:55:06 AM

## 2010-03-12 LAB — DIFFERENTIAL
Lymphocytes Relative: 7 % — ABNORMAL LOW (ref 12–46)
Lymphs Abs: 0.5 10*3/uL — ABNORMAL LOW (ref 0.7–4.0)
Monocytes Absolute: 0.1 10*3/uL (ref 0.1–1.0)
Monocytes Relative: 1 % — ABNORMAL LOW (ref 3–12)
Neutro Abs: 6.4 10*3/uL (ref 1.7–7.7)

## 2010-03-12 LAB — URINE CULTURE
Colony Count: 65000
Culture  Setup Time: 201201260143
Special Requests: NEGATIVE

## 2010-03-12 LAB — CBC
HCT: 28.9 % — ABNORMAL LOW (ref 36.0–46.0)
Hemoglobin: 9.7 g/dL — ABNORMAL LOW (ref 12.0–15.0)
MCH: 32.6 pg (ref 26.0–34.0)
MCHC: 33.6 g/dL (ref 30.0–36.0)
MCV: 97 fL (ref 78.0–100.0)

## 2010-03-12 NOTE — Consult Note (Signed)
NAMEHENRYETTA, [name] NO.:  [id number]  MEDICAL RECORD NO.:  [id number]          PATIENT TYPE:  INP  LOCATION:  1519                         FACILITY:  [facility]  PHYSICIAN:  Bernette Redbird, M.D.   DATE OF BIRTH:  06-20-1960  DATE OF CONSULTATION:  02/19/2010 DATE OF DISCHARGE:                                CONSULTATION   REASON FOR CONSULTATION:  Dr. Venetia Constable of the [facility] asked [name] to see this pleasant 50 year old female because of possible Crohn's symptoms.  HISTORY:  The patient is known to my partner, Dr. Vida Rigger, who most recently saw her about a month ago.  She has had a diverting colostomy because of a failed repair of a rectovaginal fistula associated with her Crohn's.  She does have a problem with chronic diarrhea and as of the time of his last visit, he was contemplating institution of Pentasa as well as possibly a trial of anti-TNF agents, the latter of which are of uncertain appropriateness due to a recent diagnosis of breast cancer for which she is now on preoperative chemotherapy.  With that background, the patient was admitted to the hospital a couple of days ago with nausea, vomiting and increased ostomy output and a fair amount of abdominal cramping.  From her report, the nausea and vomiting have resolved since admission with the help of some medication and she is now tolerating a clear liquid diet.  On the other hand, the ostomy output remains greater than that to which she is accustomed, and with that she is having some intestinal cramping.  ALLERGIES:  NO KNOWN ALLERGIES.  OUTPATIENT MEDICATIONS:  Mirtazapine, Compazine, Topamax, BuSpar, Zofran, chemotherapy including carboplatin on a weekly basis, levothyroxine, p.r.n. aspirin.  Note that she is not currently on any Crohn's medications.  PAST SURGICAL HISTORY:  Previous operations include repair of rectovaginal fistula and subsequent diverting colostomy last July  after breakdown of the fistula repair.  PAST MEDICAL HISTORY:  Medical illnesses include her Crohn's disease, depression, migraines, hypothyroidism and recently diagnosed breast cancer for which chemotherapy was started about a month ago.  FAMILY HISTORY:  Positive for Crohn's disease in a twin sister.  SOCIAL HISTORY:  Nonsmoker, nondrinker.  REVIEW OF SYSTEMS:  See HPI, not obtained in detail.  PHYSICAL EXAMINATION:  GENERAL:  A thin, pleasant, articulate, Caucasian female in no acute distress. HEENT:  Anicteric, no frank pallor. CHEST:  Clear. HEART:  Unremarkable. ABDOMEN:  Ostomy present in the lower abdominal region. Loud, active, gurgling bowel sounds.  No organomegaly, guarding, mass, tenderness.  LABORATORY DATA:  White count 4500, hemoglobin 9.6, MCV 94, platelets 240,000.  Differential pertinent for 14% monocytes, 65% neutrophils, 20% lymphocytes.  Chemistry panel on admission was consistent with prerenal azotemia, BUN 31, creatinine 2.0, currently BUN 12 and creatinine 0.9. Liver chemistries normal on admission, lipase normal.  RADIOGRAPH EXAMINATION:  The patient had a plain abdominal film on admission which did not show any acute findings.  She had a CT enterography last April, approximately 9 months ago, which showed active Crohn's in the distal and terminal ileum with mild low-grade obstruction in  the region of the inflammation.  The patient has had a CT scan and a colonoscopy performed 2 months ago at [facility] and we are attempting to obtain those records at this time.  IMPRESSION:  The patient's clinical presentation is compatible with either chemotherapy intolerance or possibly Crohn's-related low-grade or partial small-bowel obstruction, although the plain abdominal film on admission did not support that diagnosis.  PLAN:  I will begin with conservative interventions including Imodium for the high ostomy output and the cramps and Pentasa for her  small bowel disease.  Depending on the radiographic findings from [facility], consideration could be given to an updated radiographic evaluation, versus empiric institution of Entocort for her small bowel disease in case there is an element of small bowel obstructive symptomatology contributing to her symptoms.  We appreciate the opportunity to have seen this patient in consultation with you.          ______________________________ Bernette Redbird, M.D.     RB/MEDQ  D:  02/19/2010  T:  02/19/2010  Job:  [id number]  cc:   Petra Kuba, M.D. Fax: [phone]  Ace Gins, MD  Drue Second, MD Fax: [phone]  Dineen Kid. Rana Snare, M.D. Fax: [phone]  Electronically Signed by Bernette Redbird M.D. on 03/12/2010 04:52:18 PM

## 2010-03-13 LAB — CBC
Hemoglobin: 10.1 g/dL — ABNORMAL LOW (ref 12.0–15.0)
MCH: 32.6 pg (ref 26.0–34.0)
MCHC: 33.3 g/dL (ref 30.0–36.0)

## 2010-03-13 LAB — DIFFERENTIAL
Basophils Relative: 0 % (ref 0–1)
Eosinophils Relative: 0 % (ref 0–5)
Monocytes Absolute: 0.4 10*3/uL (ref 0.1–1.0)
Monocytes Relative: 5 % (ref 3–12)
Neutro Abs: 6.4 10*3/uL (ref 1.7–7.7)

## 2010-03-15 NOTE — Discharge Summary (Signed)
  NAMEANEYA, DADDONA NO.:  [id number]  MEDICAL RECORD NO.:  [id number]          PATIENT TYPE:  INP  LOCATION:  1320                         FACILITY:  [facility]  PHYSICIAN:  Drue Second, MD       DATE OF BIRTH:  Jul 09, 1960  DATE OF ADMISSION:  03/04/2010 DATE OF DISCHARGE:  03/12/2010                              DISCHARGE SUMMARY   ADDENDUM: An addendum to the discharge summary that was done by [name], P.A., on March 12, 2010.  Ms. Ketter was observed for another 24 hours just to make sure that she was able to tolerate her advanced diet to regular food which she tolerated quite well without any complications.  She was also observed to make sure she was not having any more episodes of diarrhea which has greatly improved and she is back to her normal bowel regimen.  She is not passing any obvious blood and is not having any new symptoms or problems or concerns to report.  We will go ahead and discharge her home today.  She does have a followup appointment at the [facility] to follow up with either Dr. Welton Flakes or the physician assistant Sharyl Nimrod on Thursday, March 18, 2010.  She knows to give [name] a call in the interim should she have any questions or concerns at all.     Eunice Blase, PA-C   ______________________________ Drue Second, MD    TS/MEDQ  D:  03/13/2010  T:  03/13/2010  Job:  [id number]  Electronically Signed by Drue Second MD on 03/15/2010 05:41:47 PM

## 2010-03-18 ENCOUNTER — Ambulatory Visit (HOSPITAL_BASED_OUTPATIENT_CLINIC_OR_DEPARTMENT_OTHER): Payer: Managed Care, Other (non HMO) | Admitting: Oncology

## 2010-03-18 DIAGNOSIS — Z5111 Encounter for antineoplastic chemotherapy: Secondary | ICD-10-CM

## 2010-03-18 DIAGNOSIS — C50419 Malignant neoplasm of upper-outer quadrant of unspecified female breast: Secondary | ICD-10-CM

## 2010-03-18 DIAGNOSIS — C773 Secondary and unspecified malignant neoplasm of axilla and upper limb lymph nodes: Secondary | ICD-10-CM

## 2010-03-18 LAB — CBC WITH DIFFERENTIAL/PLATELET
BASO%: 0 % (ref 0.0–2.0)
LYMPH%: 9.6 % — ABNORMAL LOW (ref 14.0–49.7)
MCHC: 32.9 g/dL (ref 31.5–36.0)
MCV: 99.1 fL (ref 79.5–101.0)
MONO#: 0.1 10*3/uL (ref 0.1–0.9)
MONO%: 1.5 % (ref 0.0–14.0)
Platelets: 340 10*3/uL (ref 145–400)
RBC: 3.34 10*6/uL — ABNORMAL LOW (ref 3.70–5.45)
WBC: 4.8 10*3/uL (ref 3.9–10.3)

## 2010-03-18 LAB — BASIC METABOLIC PANEL
Calcium: 8.6 mg/dL (ref 8.4–10.5)
Sodium: 140 mEq/L (ref 135–145)

## 2010-03-25 ENCOUNTER — Other Ambulatory Visit: Payer: Self-pay | Admitting: Oncology

## 2010-03-25 ENCOUNTER — Encounter (HOSPITAL_BASED_OUTPATIENT_CLINIC_OR_DEPARTMENT_OTHER): Payer: Managed Care, Other (non HMO) | Admitting: Oncology

## 2010-03-25 DIAGNOSIS — C773 Secondary and unspecified malignant neoplasm of axilla and upper limb lymph nodes: Secondary | ICD-10-CM

## 2010-03-25 DIAGNOSIS — Z5111 Encounter for antineoplastic chemotherapy: Secondary | ICD-10-CM

## 2010-03-25 DIAGNOSIS — Z5112 Encounter for antineoplastic immunotherapy: Secondary | ICD-10-CM

## 2010-03-25 DIAGNOSIS — C50419 Malignant neoplasm of upper-outer quadrant of unspecified female breast: Secondary | ICD-10-CM

## 2010-03-25 LAB — CBC WITH DIFFERENTIAL/PLATELET
Basophils Absolute: 0 10*3/uL (ref 0.0–0.1)
HCT: 33.5 % — ABNORMAL LOW (ref 34.8–46.6)
HGB: 10.9 g/dL — ABNORMAL LOW (ref 11.6–15.9)
MONO#: 0.1 10*3/uL (ref 0.1–0.9)
NEUT%: 89.6 % — ABNORMAL HIGH (ref 38.4–76.8)
WBC: 5.6 10*3/uL (ref 3.9–10.3)
lymph#: 0.5 10*3/uL — ABNORMAL LOW (ref 0.9–3.3)

## 2010-03-25 LAB — COMPREHENSIVE METABOLIC PANEL
AST: 9 U/L (ref 0–37)
Albumin: 3.5 g/dL (ref 3.5–5.2)
BUN: 18 mg/dL (ref 6–23)
Calcium: 8.4 mg/dL (ref 8.4–10.5)
Chloride: 114 mEq/L — ABNORMAL HIGH (ref 96–112)
Glucose, Bld: 132 mg/dL — ABNORMAL HIGH (ref 70–99)
Potassium: 4 mEq/L (ref 3.5–5.3)

## 2010-04-01 ENCOUNTER — Other Ambulatory Visit: Payer: Self-pay | Admitting: Oncology

## 2010-04-01 ENCOUNTER — Encounter (HOSPITAL_BASED_OUTPATIENT_CLINIC_OR_DEPARTMENT_OTHER): Payer: Managed Care, Other (non HMO) | Admitting: Oncology

## 2010-04-01 DIAGNOSIS — Z5112 Encounter for antineoplastic immunotherapy: Secondary | ICD-10-CM

## 2010-04-01 DIAGNOSIS — Z5111 Encounter for antineoplastic chemotherapy: Secondary | ICD-10-CM

## 2010-04-01 DIAGNOSIS — K5090 Crohn's disease, unspecified, without complications: Secondary | ICD-10-CM

## 2010-04-01 DIAGNOSIS — C50419 Malignant neoplasm of upper-outer quadrant of unspecified female breast: Secondary | ICD-10-CM

## 2010-04-01 LAB — CBC WITH DIFFERENTIAL/PLATELET
BASO%: 0.2 % (ref 0.0–2.0)
Basophils Absolute: 0 10*3/uL (ref 0.0–0.1)
EOS%: 0.2 % (ref 0.0–7.0)
HGB: 11.9 g/dL (ref 11.6–15.9)
MCH: 33.2 pg (ref 25.1–34.0)
NEUT#: 4.6 10*3/uL (ref 1.5–6.5)
RBC: 3.58 10*6/uL — ABNORMAL LOW (ref 3.70–5.45)
RDW: 16.3 % — ABNORMAL HIGH (ref 11.2–14.5)
lymph#: 0.6 10*3/uL — ABNORMAL LOW (ref 0.9–3.3)

## 2010-04-01 LAB — COMPREHENSIVE METABOLIC PANEL
ALT: 50 U/L — ABNORMAL HIGH (ref 0–35)
AST: 42 U/L — ABNORMAL HIGH (ref 0–37)
Albumin: 3.8 g/dL (ref 3.5–5.2)
BUN: 24 mg/dL — ABNORMAL HIGH (ref 6–23)
Calcium: 8.5 mg/dL (ref 8.4–10.5)
Chloride: 106 mEq/L (ref 96–112)
Potassium: 4.7 mEq/L (ref 3.5–5.3)
Sodium: 142 mEq/L (ref 135–145)
Total Protein: 5.9 g/dL — ABNORMAL LOW (ref 6.0–8.3)

## 2010-04-08 ENCOUNTER — Other Ambulatory Visit: Payer: Self-pay | Admitting: Oncology

## 2010-04-08 ENCOUNTER — Encounter (HOSPITAL_BASED_OUTPATIENT_CLINIC_OR_DEPARTMENT_OTHER): Payer: Managed Care, Other (non HMO) | Admitting: Oncology

## 2010-04-08 DIAGNOSIS — Z5112 Encounter for antineoplastic immunotherapy: Secondary | ICD-10-CM

## 2010-04-08 DIAGNOSIS — Z5111 Encounter for antineoplastic chemotherapy: Secondary | ICD-10-CM

## 2010-04-08 DIAGNOSIS — C50419 Malignant neoplasm of upper-outer quadrant of unspecified female breast: Secondary | ICD-10-CM

## 2010-04-08 LAB — CBC WITH DIFFERENTIAL/PLATELET
BASO%: 0.5 % (ref 0.0–2.0)
EOS%: 0 % (ref 0.0–7.0)
HCT: 32.1 % — ABNORMAL LOW (ref 34.8–46.6)
LYMPH%: 13.6 % — ABNORMAL LOW (ref 14.0–49.7)
MCH: 33.7 pg (ref 25.1–34.0)
MCHC: 34 g/dL (ref 31.5–36.0)
NEUT%: 77.5 % — ABNORMAL HIGH (ref 38.4–76.8)
RBC: 3.23 10*6/uL — ABNORMAL LOW (ref 3.70–5.45)
lymph#: 0.8 10*3/uL — ABNORMAL LOW (ref 0.9–3.3)

## 2010-04-08 LAB — BASIC METABOLIC PANEL
BUN: 29 mg/dL — ABNORMAL HIGH (ref 6–23)
Creatinine, Ser: 1.14 mg/dL (ref 0.40–1.20)
Potassium: 3.5 mEq/L (ref 3.5–5.3)

## 2010-04-14 ENCOUNTER — Encounter (HOSPITAL_BASED_OUTPATIENT_CLINIC_OR_DEPARTMENT_OTHER): Payer: Managed Care, Other (non HMO) | Admitting: Oncology

## 2010-04-14 ENCOUNTER — Other Ambulatory Visit: Payer: Self-pay | Admitting: Oncology

## 2010-04-14 DIAGNOSIS — Z5112 Encounter for antineoplastic immunotherapy: Secondary | ICD-10-CM

## 2010-04-14 DIAGNOSIS — Z136 Encounter for screening for cardiovascular disorders: Secondary | ICD-10-CM

## 2010-04-14 DIAGNOSIS — C50419 Malignant neoplasm of upper-outer quadrant of unspecified female breast: Secondary | ICD-10-CM

## 2010-04-14 DIAGNOSIS — C50919 Malignant neoplasm of unspecified site of unspecified female breast: Secondary | ICD-10-CM

## 2010-04-14 LAB — BASIC METABOLIC PANEL
CO2: 26 mEq/L (ref 19–32)
Glucose, Bld: 114 mg/dL — ABNORMAL HIGH (ref 70–99)
Potassium: 3.8 mEq/L (ref 3.5–5.3)
Sodium: 139 mEq/L (ref 135–145)

## 2010-04-14 LAB — CBC WITH DIFFERENTIAL/PLATELET
Eosinophils Absolute: 0 10*3/uL (ref 0.0–0.5)
MONO#: 0.3 10*3/uL (ref 0.1–0.9)
NEUT#: 2.8 10*3/uL (ref 1.5–6.5)
RBC: 3.01 10*6/uL — ABNORMAL LOW (ref 3.70–5.45)
RDW: 17.1 % — ABNORMAL HIGH (ref 11.2–14.5)
WBC: 3.7 10*3/uL — ABNORMAL LOW (ref 3.9–10.3)

## 2010-04-15 ENCOUNTER — Encounter (HOSPITAL_BASED_OUTPATIENT_CLINIC_OR_DEPARTMENT_OTHER): Payer: Managed Care, Other (non HMO) | Admitting: Oncology

## 2010-04-15 DIAGNOSIS — C50419 Malignant neoplasm of upper-outer quadrant of unspecified female breast: Secondary | ICD-10-CM

## 2010-04-15 DIAGNOSIS — Z5112 Encounter for antineoplastic immunotherapy: Secondary | ICD-10-CM

## 2010-04-22 ENCOUNTER — Encounter (HOSPITAL_BASED_OUTPATIENT_CLINIC_OR_DEPARTMENT_OTHER): Payer: Managed Care, Other (non HMO) | Admitting: Oncology

## 2010-04-22 ENCOUNTER — Other Ambulatory Visit: Payer: Self-pay | Admitting: Oncology

## 2010-04-22 DIAGNOSIS — K5090 Crohn's disease, unspecified, without complications: Secondary | ICD-10-CM

## 2010-04-22 DIAGNOSIS — C50419 Malignant neoplasm of upper-outer quadrant of unspecified female breast: Secondary | ICD-10-CM

## 2010-04-22 DIAGNOSIS — Z5112 Encounter for antineoplastic immunotherapy: Secondary | ICD-10-CM

## 2010-04-22 LAB — BASIC METABOLIC PANEL
BUN: 18 mg/dL (ref 6–23)
Chloride: 100 mEq/L (ref 96–112)
Creatinine, Ser: 1.07 mg/dL (ref 0.40–1.20)
Glucose, Bld: 106 mg/dL — ABNORMAL HIGH (ref 70–99)

## 2010-04-22 LAB — CBC WITH DIFFERENTIAL/PLATELET
BASO%: 0.5 % (ref 0.0–2.0)
HCT: 31.8 % — ABNORMAL LOW (ref 34.8–46.6)
LYMPH%: 32.3 % (ref 14.0–49.7)
MCH: 35.7 pg — ABNORMAL HIGH (ref 25.1–34.0)
MCHC: 35.2 g/dL (ref 31.5–36.0)
MONO#: 0.4 10*3/uL (ref 0.1–0.9)
NEUT%: 60.6 % (ref 38.4–76.8)
Platelets: 152 10*3/uL (ref 145–400)

## 2010-04-22 NOTE — Consult Note (Signed)
[name], [name]                   ACCOUNT NO.:  [id number]  MEDICAL RECORD NO.:  [id number]          PATIENT TYPE:  INP  LOCATION:  1320                         FACILITY:  [facility]  PHYSICIAN:  Dwight Burdo C. Madilyn Fireman, M.D.    DATE OF BIRTH:  Jan 08, 1961  DATE OF CONSULTATION:  03/05/2010 DATE OF DISCHARGE:                                CONSULTATION   REASON FOR CONSULTATION:  Diarrhea, dehydration and Crohn disease.  HISTORY OF PRESENT ILLNESS:  The patient is a 50 year old white female with long history of Crohn ileocolitis with recently diagnosed breast cancer who is admitted for recurrent dehydration and refractory diarrhea of 2-3 days' duration.  Her recent course is as follows:  She had a surgical repair of rectovaginal fistula at [facility] and had subsequent suboptimal course requiring temporary colostomy.  Plans were made to reverse her colostomy as well as resect some of the diseased bowel in December but she was diagnosed with breast cancer in November and was started on combination chemotherapy prior to any surgery, precluding reversal of her colostomy.  While on chemotherapy, she developed diarrhea and dehydration and was admitted from February 17, 2010 to February 20, 2010.  C difficile toxin was negative.  She was found to have urinary tract infection and was treated with Keflex which she was on 5 days afterward.  She was also kept on Pentasa.  She seemed to improve fairly quickly and remained improved until about 3 days ago when she began developing nonbloody diarrhea with worsening cramps and diarrhea.  Overall, she thinks her symptoms are similar but somewhat worse than during her last admission.  She has not seen her primary gastroenterologist, Dr. Ewing Schlein between these admissions.  PAST MEDICAL HISTORY:  Crohn disease, identical twin sister also has Crohn disease, depression, migraine headache, hypothyroidism, and recently diagnosed breast cancer.  ALLERGIES:  None  known.  MEDICATIONS: 1. Wellbutrin. 2. Synthroid. 3. Topamax. 4. Compazine. 5. Zofran. 6. Pentasa.  PHYSICAL EXAMINATION:  GENERAL:  Thin dark-complexed white female in no acute distress. HEART:  Regular rate and rhythm without murmur. ABDOMEN:  Soft with intact colostomy with liquid brown stool.  LABORATORY DATA:  WBC 3100, platelets 132,000, hemoglobin 10.  Sodium 134 and potassium 3.1.  Creatinine and liver function tests are normal.  IMPRESSION:  Worsening diarrhea and dehydration in a patient with known Crohn disease with temporary colostomy, also status post cephalosporin antibiotics up until about a week ago.  PLAN:  Treatment and testing for C difficile initially.  Use of Pentasa acutely probably optional, currently on hold.  She has already been started on Cipro and Flagyl empirically.  If C difficile is negative, would consider given the history of antibiotic use endoscopy through her colostomy to more conclusively rule out pseudomembranes, especially before considering any immunosuppressive medication for Crohn disease such as corticosteroids or biological agents.  We will follow with you.          ______________________________ Everardo All. Madilyn Fireman, M.D.     JCH/MEDQ  D:  03/05/2010  T:  03/05/2010  Job:  [id number]  Electronically Signed by Doreatha Massed.D.  on 04/20/2010 07:04:21 PM

## 2010-04-23 ENCOUNTER — Encounter (HOSPITAL_BASED_OUTPATIENT_CLINIC_OR_DEPARTMENT_OTHER): Payer: Managed Care, Other (non HMO) | Admitting: Oncology

## 2010-04-23 DIAGNOSIS — C50419 Malignant neoplasm of upper-outer quadrant of unspecified female breast: Secondary | ICD-10-CM

## 2010-04-27 LAB — COMPREHENSIVE METABOLIC PANEL
ALT: 21 U/L (ref 0–35)
AST: 19 U/L (ref 0–37)
Albumin: 4.2 g/dL (ref 3.5–5.2)
Calcium: 9.3 mg/dL (ref 8.4–10.5)
GFR calc Af Amer: >60 mL/min (ref >60)
Sodium: 137 mEq/L (ref 135–145)
Total Protein: 6.7 g/dL (ref 6.0–8.3)

## 2010-04-27 LAB — DIFFERENTIAL
Eosinophils Absolute: 0.1 10*3/uL (ref 0.0–0.7)
Eosinophils Relative: 1 % (ref 0–5)
Lymphocytes Relative: 26 % (ref 12–46)
Lymphs Abs: 1.1 10*3/uL (ref 0.7–4.0)
Monocytes Relative: 8 % (ref 3–12)

## 2010-04-27 LAB — CBC
MCH: 31.5 pg (ref 26.0–34.0)
MCHC: 33.6 g/dL (ref 30.0–36.0)
Platelets: 272 10*3/uL (ref 150–400)
RBC: 4.09 MIL/uL (ref 3.87–5.11)
RDW: 13.8 % (ref 11.5–15.5)

## 2010-04-29 ENCOUNTER — Encounter (HOSPITAL_BASED_OUTPATIENT_CLINIC_OR_DEPARTMENT_OTHER): Payer: Managed Care, Other (non HMO) | Admitting: Oncology

## 2010-04-29 ENCOUNTER — Other Ambulatory Visit: Payer: Self-pay | Admitting: Oncology

## 2010-04-29 DIAGNOSIS — Z5112 Encounter for antineoplastic immunotherapy: Secondary | ICD-10-CM

## 2010-04-29 DIAGNOSIS — K5090 Crohn's disease, unspecified, without complications: Secondary | ICD-10-CM

## 2010-04-29 DIAGNOSIS — Z5111 Encounter for antineoplastic chemotherapy: Secondary | ICD-10-CM

## 2010-04-29 DIAGNOSIS — C50419 Malignant neoplasm of upper-outer quadrant of unspecified female breast: Secondary | ICD-10-CM

## 2010-04-29 LAB — CBC WITH DIFFERENTIAL/PLATELET
Basophils Absolute: 0.1 10*3/uL (ref 0.0–0.1)
Eosinophils Absolute: 0 10*3/uL (ref 0.0–0.5)
HGB: 7.6 g/dL — ABNORMAL LOW (ref 11.6–15.9)
LYMPH%: 25.7 % (ref 14.0–49.7)
MCV: 103.4 fL — ABNORMAL HIGH (ref 79.5–101.0)
MONO#: 0.1 10*3/uL (ref 0.1–0.9)
MONO%: 2.7 % (ref 0.0–14.0)
NEUT#: 1.4 10*3/uL — ABNORMAL LOW (ref 1.5–6.5)
Platelets: 204 10*3/uL (ref 145–400)
RDW: 16.4 % — ABNORMAL HIGH (ref 11.2–14.5)
WBC: 2.2 10*3/uL — ABNORMAL LOW (ref 3.9–10.3)

## 2010-04-29 LAB — COMPREHENSIVE METABOLIC PANEL
Albumin: 2.8 g/dL — ABNORMAL LOW (ref 3.5–5.2)
Alkaline Phosphatase: 57 U/L (ref 39–117)
BUN: 20 mg/dL (ref 6–23)
CO2: 27 mEq/L (ref 19–32)
Glucose, Bld: 130 mg/dL — ABNORMAL HIGH (ref 70–99)
Potassium: 4.2 mEq/L (ref 3.5–5.3)
Sodium: 141 mEq/L (ref 135–145)
Total Protein: 4.5 g/dL — ABNORMAL LOW (ref 6.0–8.3)

## 2010-04-30 ENCOUNTER — Encounter (HOSPITAL_BASED_OUTPATIENT_CLINIC_OR_DEPARTMENT_OTHER): Payer: Managed Care, Other (non HMO) | Admitting: Oncology

## 2010-04-30 DIAGNOSIS — C50419 Malignant neoplasm of upper-outer quadrant of unspecified female breast: Secondary | ICD-10-CM

## 2010-04-30 DIAGNOSIS — Z5189 Encounter for other specified aftercare: Secondary | ICD-10-CM

## 2010-05-01 ENCOUNTER — Encounter (HOSPITAL_BASED_OUTPATIENT_CLINIC_OR_DEPARTMENT_OTHER): Payer: Managed Care, Other (non HMO) | Admitting: Oncology

## 2010-05-01 DIAGNOSIS — C50419 Malignant neoplasm of upper-outer quadrant of unspecified female breast: Secondary | ICD-10-CM

## 2010-05-01 DIAGNOSIS — Z5189 Encounter for other specified aftercare: Secondary | ICD-10-CM

## 2010-05-02 ENCOUNTER — Encounter (HOSPITAL_BASED_OUTPATIENT_CLINIC_OR_DEPARTMENT_OTHER): Payer: Managed Care, Other (non HMO) | Admitting: Oncology

## 2010-05-02 DIAGNOSIS — Z5189 Encounter for other specified aftercare: Secondary | ICD-10-CM

## 2010-05-02 DIAGNOSIS — C50419 Malignant neoplasm of upper-outer quadrant of unspecified female breast: Secondary | ICD-10-CM

## 2010-05-02 LAB — BASIC METABOLIC PANEL
BUN: 10 mg/dL (ref 6–23)
CO2: 25 mEq/L (ref 19–32)
CO2: 28 mEq/L (ref 19–32)
Calcium: 8.3 mg/dL — ABNORMAL LOW (ref 8.4–10.5)
Calcium: 8.7 mg/dL (ref 8.4–10.5)
Creatinine, Ser: 1.1 mg/dL (ref 0.4–1.2)
Creatinine, Ser: 1.13 mg/dL (ref 0.4–1.2)
GFR calc Af Amer: >60 mL/min (ref >60)
GFR calc non Af Amer: 52 mL/min — ABNORMAL LOW (ref >60)
GFR calc non Af Amer: 53 mL/min — ABNORMAL LOW (ref >60)
Glucose, Bld: 106 mg/dL — ABNORMAL HIGH (ref 70–99)
Sodium: 138 mEq/L (ref 135–145)
Sodium: 142 mEq/L (ref 135–145)

## 2010-05-02 LAB — TYPE AND SCREEN
ABO/RH(D): O POS
Antibody Screen: NEGATIVE

## 2010-05-02 LAB — CBC
Hemoglobin: 11.7 g/dL — ABNORMAL LOW (ref 12.0–15.0)
Hemoglobin: 12.6 g/dL (ref 12.0–15.0)
MCH: 33 pg (ref 26.0–34.0)
MCH: 33.2 pg (ref 26.0–34.0)
MCHC: 34.3 g/dL (ref 30.0–36.0)
Platelets: 266 10*3/uL (ref 150–400)
Platelets: 279 10*3/uL (ref 150–400)
RBC: 3.82 MIL/uL — ABNORMAL LOW (ref 3.87–5.11)
RDW: 14 % (ref 11.5–15.5)

## 2010-05-03 ENCOUNTER — Other Ambulatory Visit: Payer: Self-pay | Admitting: Oncology

## 2010-05-03 ENCOUNTER — Encounter (HOSPITAL_COMMUNITY)
Admission: RE | Admit: 2010-05-03 | Discharge: 2010-05-03 | Disposition: A | Discharge status: Home / Self Care | Payer: Managed Care, Other (non HMO) | Source: Ambulatory Visit | Attending: Oncology | Admitting: Oncology

## 2010-05-03 ENCOUNTER — Encounter (HOSPITAL_BASED_OUTPATIENT_CLINIC_OR_DEPARTMENT_OTHER): Payer: Managed Care, Other (non HMO) | Admitting: Oncology

## 2010-05-03 DIAGNOSIS — C50419 Malignant neoplasm of upper-outer quadrant of unspecified female breast: Secondary | ICD-10-CM

## 2010-05-03 DIAGNOSIS — D649 Anemia, unspecified: Secondary | ICD-10-CM | POA: Insufficient documentation

## 2010-05-03 LAB — BASIC METABOLIC PANEL
CO2: 24 mEq/L (ref 19–32)
Calcium: 9.1 mg/dL (ref 8.4–10.5)
Chloride: 111 mEq/L (ref 96–112)
Chloride: 115 mEq/L — ABNORMAL HIGH (ref 96–112)
Creatinine, Ser: 0.99 mg/dL (ref 0.4–1.2)
GFR calc Af Amer: >60 mL/min (ref >60)
GFR calc non Af Amer: >60 mL/min (ref >60)
Potassium: 4.6 mEq/L (ref 3.5–5.1)
Sodium: 139 mEq/L (ref 135–145)
Sodium: 144 mEq/L (ref 135–145)

## 2010-05-03 LAB — DIFFERENTIAL
Basophils Absolute: 0 10*3/uL (ref 0.0–0.1)
Lymphocytes Relative: 15 % (ref 12–46)
Lymphs Abs: 1.4 10*3/uL (ref 0.7–4.0)
Lymphs Abs: 1.6 10*3/uL (ref 0.7–4.0)
Monocytes Absolute: 0.4 10*3/uL (ref 0.1–1.0)
Monocytes Absolute: 0.6 10*3/uL (ref 0.1–1.0)
Monocytes Relative: 7 % (ref 3–12)
Monocytes Relative: 7 % (ref 3–12)
Neutro Abs: 4 10*3/uL (ref 1.7–7.7)
Neutro Abs: 7 10*3/uL (ref 1.7–7.7)
Neutrophils Relative %: 65 % (ref 43–77)

## 2010-05-03 LAB — CBC WITH DIFFERENTIAL/PLATELET
Basophils Absolute: 0 10*3/uL (ref 0.0–0.1)
EOS%: 0 % (ref 0.0–7.0)
HCT: 26.5 % — ABNORMAL LOW (ref 34.8–46.6)
HGB: 9 g/dL — ABNORMAL LOW (ref 11.6–15.9)
MCH: 35.2 pg — ABNORMAL HIGH (ref 25.1–34.0)
MCV: 103.9 fL — ABNORMAL HIGH (ref 79.5–101.0)
NEUT%: 81.7 % — ABNORMAL HIGH (ref 38.4–76.8)
Platelets: 313 10*3/uL (ref 145–400)
lymph#: 0.4 10*3/uL — ABNORMAL LOW (ref 0.9–3.3)

## 2010-05-03 LAB — CBC
HCT: 39.4 % (ref 36.0–46.0)
Hemoglobin: 12.9 g/dL (ref 12.0–15.0)
Hemoglobin: 13.5 g/dL (ref 12.0–15.0)
MCV: 97.8 fL (ref 78.0–100.0)
RBC: 4 MIL/uL (ref 3.87–5.11)
RBC: 4.08 MIL/uL (ref 3.87–5.11)
WBC: 6.1 10*3/uL (ref 4.0–10.5)
WBC: 9.2 10*3/uL (ref 4.0–10.5)

## 2010-05-03 LAB — SURGICAL PCR SCREEN
MRSA, PCR: NEGATIVE
Staphylococcus aureus: NEGATIVE

## 2010-05-04 ENCOUNTER — Encounter (HOSPITAL_BASED_OUTPATIENT_CLINIC_OR_DEPARTMENT_OTHER): Payer: Managed Care, Other (non HMO) | Admitting: Oncology

## 2010-05-04 DIAGNOSIS — C50419 Malignant neoplasm of upper-outer quadrant of unspecified female breast: Secondary | ICD-10-CM

## 2010-05-04 DIAGNOSIS — D649 Anemia, unspecified: Secondary | ICD-10-CM

## 2010-05-05 ENCOUNTER — Encounter (HOSPITAL_BASED_OUTPATIENT_CLINIC_OR_DEPARTMENT_OTHER): Payer: Managed Care, Other (non HMO) | Admitting: Oncology

## 2010-05-05 DIAGNOSIS — D649 Anemia, unspecified: Secondary | ICD-10-CM

## 2010-05-05 DIAGNOSIS — C50419 Malignant neoplasm of upper-outer quadrant of unspecified female breast: Secondary | ICD-10-CM

## 2010-05-06 ENCOUNTER — Encounter (HOSPITAL_BASED_OUTPATIENT_CLINIC_OR_DEPARTMENT_OTHER): Payer: Managed Care, Other (non HMO) | Admitting: Oncology

## 2010-05-06 ENCOUNTER — Other Ambulatory Visit: Payer: Self-pay | Admitting: Oncology

## 2010-05-06 DIAGNOSIS — C50419 Malignant neoplasm of upper-outer quadrant of unspecified female breast: Secondary | ICD-10-CM

## 2010-05-06 DIAGNOSIS — Z5112 Encounter for antineoplastic immunotherapy: Secondary | ICD-10-CM

## 2010-05-06 DIAGNOSIS — K5090 Crohn's disease, unspecified, without complications: Secondary | ICD-10-CM

## 2010-05-06 LAB — CROSSMATCH: Unit division: 0

## 2010-05-06 LAB — CBC WITH DIFFERENTIAL/PLATELET
Eosinophils Absolute: 0 10*3/uL (ref 0.0–0.5)
MONO#: 0.5 10*3/uL (ref 0.1–0.9)
NEUT#: 5.3 10*3/uL (ref 1.5–6.5)
RBC: 3.64 10*6/uL — ABNORMAL LOW (ref 3.70–5.45)
RDW: 19.1 % — ABNORMAL HIGH (ref 11.2–14.5)
WBC: 6.9 10*3/uL (ref 3.9–10.3)
lymph#: 1.1 10*3/uL (ref 0.9–3.3)
nRBC: 1 % — ABNORMAL HIGH (ref 0–0)

## 2010-05-06 LAB — BASIC METABOLIC PANEL
CO2: 28 mEq/L (ref 19–32)
Chloride: 102 mEq/L (ref 96–112)
Sodium: 143 mEq/L (ref 135–145)

## 2010-05-06 NOTE — H&P (Signed)
[name], [name] NO.:  [id number]  MEDICAL RECORD NO.:  [id number]          PATIENT TYPE:  INP  LOCATION:  1320                         FACILITY:  [facility]  PHYSICIAN:  Leah Second, MD       DATE OF BIRTH:  07/11/1960  DATE OF ADMISSION:  03/04/2010 DATE OF DISCHARGE:                             HISTORY & PHYSICAL   SUBJECTIVE/HISTORY OF PRESENT ILLNESS:  [name] is a pleasant, but unfortunate 50 year old [place], [place] woman with a newly diagnosed ER/PR positive, her2 positive right breast carcinoma and has been treated by Dr. Welton Flakes in the [facility] in the neoadjuvant setting.  She was technically due for week 2, cycle 2 of Taxotere, carboplatin and Herceptin given in combination 3 weeks on Herceptin, alone on week 4 at that time and she presented to our office today.  When she was seen 1 week ago, we actually held of Taxotere and carboplatin component due to the fact that [name] had recently been discharged from the [facility] as an inpatient for flare of her underlying Crohn's.  She was also noted to have some peeling of her hands that made [name] consider mild-to-moderate PPE change without restarting.  Therefore, she received Herceptin alone and presented today for reassessment.  Upon assessment, she was noted to be severely hypotensive with a blood pressure of 96/60, she had lost approximately 11 pounds, and states that she was having watery explosive stools in her ostomy and had since at least March 02, 2010.  Upon further questioning, she states that she did contact Dr. Matthias Hughs on March 03, 2010.  It was highly recommended that she present to the emergency department, but she refused.  She denies any fever.  She denies any chills.  She is short of breath, easily fatigued.  She states that she is urinating, but upon further questioning, it is really questionable how much p.o. intake she has had.  She  states that she is "drinking water," but that her urine is quite concentrated.  She has not had any solid intake since at least this past weekend, specifically about 4 days ago.  She was not noted to be neutropenic, but given her overall condition, it was felt prudent for hospitalization for IV fluid support during bowel rest and GI consult.  PAST MEDICAL HISTORY: 1. Crohn's. 2. History of rectovaginal fistulas. 3. Depression. 4. Migraines. 5. Hypothyroidism.  PAST SURGICAL HISTORY:  She has had multiple bowel resections since she has been in her 30s.  She recently had a rectovaginal fistula with colostomy performed.  She is scheduled for a takedown colostomy in early December, but this has not been performed.  She has had a prior C- section, endometrial ablation and appendectomy.  ALLERGIES:  No known drug allergies.  MEDICATIONS AT THE TIME OF ADMISSION: 1. Wellbutrin 450 mg p.o. daily. 2. Synthroid 200 mcg daily. 3. Topamax 100 mg daily. 4. Compazine 10 mg p.o. q.6-8 h. p.r.n. nausea, emesis. 5. Zofran 8 mg 1 p.o. q.8-12 h. p.r.n. nausea, emesis.  FAMILY MEDICAL HISTORY:  Significant history is  she has a twin sister who also suffers from Crohn's.  There is no known family history of breast carcinoma.  Maternal uncle with leukemia, died in his 35s; a cousin with some form of cancer, but it is not known.  GYNECOLOGIC HISTORY:  Menarche at the age of 30.  She is premenopausal as menstrual cycle not documented.  G3, P2-0-1.  She did breastfeed her children.  Screening mammograms in her 58s.  Also, has had bilateral subpectoral implants in place.  SOCIAL HISTORY:  Patient resides in [place], [place] with her husband.  She does not currently smoke, but she did smoke up to 3 years prior, 1 pack a day for 33 years.  She does not drink.  She has a 51- year-old son, 59 year old son and 15-month-old grandson.  She is on disability and again she has an identical twin.  REVIEW  OF SYSTEMS:  As per HPI, otherwise noncontributory.  OBJECTIVE/PHYSICAL EXAMINATION:  VITAL SIGNS:  Blood pressure 96/60, pulse 100, respirations 20, temperature 96.6, weight 135.9 pounds. HEENT:  Conjunctivae dry.  Sclerae anicteric.  Oropharynx shows dry oral mucosa.  No oral mucositis.  Of note, skin is peeling over her naris region. LUNGS:  Clear to auscultation. HEART:  Regular rate and rhythm. ABDOMEN:  Normal to hyperactive bowel sounds are present.  Tender to gentle manipulation. EXTREMITIES:  Show significant skin peeling, but without erythema per se.  No pedal edema.  LABORATORY DATA:  At the time of this dictation, CBC, hemoglobin 14.0 g, platelet count 155,000, WBC 5100 with an ANC of 2700.  Stat CMET pending.  IMPRESSION/RECOMMENDATIONS: 1. Suspected acute Crohn's flare:  Patient will be admitted to the     third floor oncology service under the care of Dr. Welton Flakes for bowel     rest including intravenous fluid hydration and intravenous     antibiotic coverage after blood cultures via port and peripheral     have been obtained.  She will be covered with Cipro 400 mg p.o.     q.12 h. and Flagyl 500 mg q.8 h.  We have called in a     gastrointestinal consult with Dr. Bosie Clos, who is covering for Dr.     Sandi Mariscal for management in acute Crohn's situation. 2. History of breast carcinoma:  We will need to let this Crohn's     flare subside before we can even consider reintroducing neoadjuvant     chemotherapy.  This has been discussed with the patient. 3. Anxiety/depression:  She will continue on her Wellbutrin orally. 4. Hypothyroidism:  Continue on Levoxyl.  Case has been reviewed with Dr. Welton Flakes, who agrees with admission.     Amada Kingfisher, P.A.   ______________________________ Leah Second, MD    CTS/MEDQ  D:  03/04/2010  T:  03/04/2010  Job:  [id number]  Electronically Signed by Sharyl Nimrod P.A. on 04/21/2010 04:08:51 PM Electronically Signed by  Leah Second MD on 05/06/2010 05:20:14 PM

## 2010-05-06 NOTE — Discharge Summary (Signed)
NAMEJAILEY, [name] NO.:  [id number]  MEDICAL RECORD NO.:  [id number]          PATIENT TYPE:  INP  LOCATION:  1320                         FACILITY:  [facility]  PHYSICIAN:  Drue Second, MD.     DATE OF BIRTH:  05-Nov-1960  DATE OF ADMISSION:  03/04/2010 DATE OF DISCHARGE:  03/12/2010                              DISCHARGE SUMMARY   ANTICIPATED DATE OF DISCHARGE:  March 12, 2010  CONDITION ON DISCHARGE:  Improved.  CONSULTATIONS:  GI physician, Dr. Madilyn Fireman, on March 05, 2010.  DISCHARGE DIAGNOSES: 1. Locally advanced right breast carcinoma, ER/PR positive, HER-2     positive, status post cycle 1 of Taxotere, carboplatin and     Herceptin given in combination 3 weeks on, Herceptin alone on week     4, with week 2 cycle 2 currently on hold due to flare of Crohn's     disease. 2. Crohn's disease, with flare during this admission, now in the     process of resolving. 3. Diarrhea secondary to Crohn's disease, improving. 4. Dehydration secondary to diarrhea, improving. 5. Hypokalemia, resolved with replenishment. 6. Anemia, continues to be monitored.  OTHER KNOWN MEDICAL PROBLEMS: 1. Depression. 2. Migraine headaches. 3. Hypothyroidism.  PROCEDURES:  Status post colonoscopy via colostomy, with biopsy, Dr. Dulce Sellar, on March 10, 2010.  MEDICATIONS:  Medications on discharge, as per [facility].  Anticipated that the patient is to be discharged on same home meds and other GI prescriptions will be written by specialty.  LABORATORY DATA ON DISCHARGE:  White count 3.8, hemoglobin 9.9, hematocrit 30, MCV 97.7, platelets 196, neutrophils 3.4, lymphocytes 0.4, monocytes 0.0.  Sodium 146, potassium 4.4, BUN 5, creatinine 0.88, glucose 96, total bilirubin 0.5, alkaline phosphatase 51, AST 15, ALT 12, total protein 5.8, albumin 2.8, calcium 9.0.  Stool culture negative.  Blood culture negative.  Urinary culture pending.  TSH 0.494.  RADIOLOGICAL STUDIES:   None.  FOLLOWUP:  As per Dr. Milta Deiters instructions.  HISTORY OF PRESENT ILLNESS:  [name] is a very pleasant 50 year old white female with long history of Crohn ileocolitis who was recently diagnosed with breast cancer, on the right breast, ER/PR positive, HER-2 positive, who had been treated by Dr. Welton Flakes at the [facility] in the neoadjuvant setting.  She was technically due for week 2 cycle 2 of Taxotere, carboplatin and Herceptin, given in combination 3 weeks on, Herceptin, alone on week 4; at that time presenting to our office with severe hypotension and 11-pound weight loss, watery explosive stools in her ostomy for 2 days, following instructions to present to the ED for treatment of symptoms.  Upon admission, she was given IV fluid support, and a GI consultation was requested, anticipating that these symptoms were secondary to acute Crohn's flare.  After blood cultures were obtained via port, and peripheral cultures were taken as well, and stools for ova and parasites and C difficile were drawn, she was covered with Cipro and Flagyl.  The fourth consultation was obtained.  Dr. Madilyn Fireman has seen and evaluated the patient, and eventually she was started on Pentasa on March 06, 2010, at 1000 mg p.o. q.i.d.  In addition, the patient is taking Florastor 250 mg p.o. b.i.d.  Her condition continued to be monitored during the rest of her hospitalization.  Initially, her cramps, abdominal pain, and diarrhea were very evident, and with the help of these medications, these symptoms began to improve.  Once more stable, Dr. Dulce Sellar proceeded with a colonoscopy on March 10, 2010, revealing normal colon without evidence of colitis or pseudomembranes but biopsies were taken which results are currently pending.  The terminal ileum was showing abnormal appearance compatible with active Crohn disease.  Today, she is continuing to have some cramping but is improved.  Her  nausea is not as evident.  She appears more hydrated as well.  Her vital signs are stable.  Her diarrhea is not as frequent.  Hopefully, steroids will help, as recommended by GI.  It is anticipated that she is to be discharged on March 12, 2010, if she continues to improve and her other medical problems are stable. She is to follow up at the [facility] once discharged as followup arrangement is being made.  The patient knows to call if she has any questions or concerns to Dr. Milta Deiters office.    [name], PA-C   Drue Second, MD     SW/MEDQ  D:  03/11/2010  T:  03/11/2010  Job:  [id number]  Electronically Signed by [name] P.A. on 03/12/2010 08:41:44 AM Electronically Signed by Drue Second MD on 05/06/2010 05:20:12 PM

## 2010-05-08 ENCOUNTER — Inpatient Hospital Stay (HOSPITAL_COMMUNITY)
Admission: EM | Admit: 2010-05-08 | Discharge: 2010-05-14 | DRG: 386 | Disposition: A | Discharge status: Home / Self Care | Payer: Managed Care, Other (non HMO) | Attending: General Surgery | Admitting: General Surgery

## 2010-05-08 ENCOUNTER — Emergency Department (HOSPITAL_COMMUNITY): Payer: Managed Care, Other (non HMO)

## 2010-05-08 ENCOUNTER — Inpatient Hospital Stay (HOSPITAL_COMMUNITY): Payer: Managed Care, Other (non HMO)

## 2010-05-08 DIAGNOSIS — C50919 Malignant neoplasm of unspecified site of unspecified female breast: Secondary | ICD-10-CM | POA: Diagnosis present

## 2010-05-08 DIAGNOSIS — K5669 Other intestinal obstruction: Secondary | ICD-10-CM | POA: Diagnosis present

## 2010-05-08 DIAGNOSIS — Z933 Colostomy status: Secondary | ICD-10-CM

## 2010-05-08 DIAGNOSIS — N824 Other female intestinal-genital tract fistulae: Secondary | ICD-10-CM | POA: Diagnosis present

## 2010-05-08 DIAGNOSIS — K5090 Crohn's disease, unspecified, without complications: Principal | ICD-10-CM | POA: Diagnosis present

## 2010-05-08 DIAGNOSIS — Z9221 Personal history of antineoplastic chemotherapy: Secondary | ICD-10-CM

## 2010-05-08 LAB — CBC
HCT: 46.3 % — ABNORMAL HIGH (ref 36.0–46.0)
MCV: 102.2 fL — ABNORMAL HIGH (ref 78.0–100.0)
Platelets: 362 10*3/uL (ref 150–400)
RBC: 4.53 MIL/uL (ref 3.87–5.11)
WBC: 8.8 10*3/uL (ref 4.0–10.5)

## 2010-05-08 LAB — URINE MICROSCOPIC-ADD ON

## 2010-05-08 LAB — COMPREHENSIVE METABOLIC PANEL
AST: 11 U/L (ref 0–37)
Albumin: 3 g/dL — ABNORMAL LOW (ref 3.5–5.2)
Alkaline Phosphatase: 77 U/L (ref 39–117)
Chloride: 107 mEq/L (ref 96–112)
GFR calc Af Amer: 57 mL/min — ABNORMAL LOW (ref >60)
Potassium: 4 mEq/L (ref 3.5–5.1)
Sodium: 135 mEq/L (ref 135–145)
Total Bilirubin: 1 mg/dL (ref 0.3–1.2)

## 2010-05-08 LAB — URINALYSIS, ROUTINE W REFLEX MICROSCOPIC
Glucose, UA: NEGATIVE mg/dL
Hgb urine dipstick: NEGATIVE
Ketones, ur: NEGATIVE mg/dL
Protein, ur: 30 mg/dL — AB
pH: 5.5 (ref 5.0–8.0)

## 2010-05-08 LAB — DIFFERENTIAL
Eosinophils Relative: 0 % (ref 0–5)
Monocytes Absolute: 0.4 10*3/uL (ref 0.1–1.0)
Monocytes Relative: 5 % (ref 3–12)
Neutrophils Relative %: 79 % — ABNORMAL HIGH (ref 43–77)

## 2010-05-08 MED ORDER — IOHEXOL 300 MG/ML  SOLN
100.0000 mL | Freq: Once | INTRAMUSCULAR | Status: AC | PRN
  Administered 2010-05-08: 100 mL via INTRAVENOUS

## 2010-05-09 DIAGNOSIS — C50919 Malignant neoplasm of unspecified site of unspecified female breast: Secondary | ICD-10-CM

## 2010-05-09 DIAGNOSIS — K56609 Unspecified intestinal obstruction, unspecified as to partial versus complete obstruction: Secondary | ICD-10-CM

## 2010-05-09 LAB — CBC
Hemoglobin: 10.5 g/dL — ABNORMAL LOW (ref 12.0–15.0)
MCHC: 33.1 g/dL (ref 30.0–36.0)
Platelets: 270 10*3/uL (ref 150–400)

## 2010-05-09 LAB — BASIC METABOLIC PANEL
CO2: 25 mEq/L (ref 19–32)
Calcium: 7.6 mg/dL — ABNORMAL LOW (ref 8.4–10.5)
Creatinine, Ser: 0.69 mg/dL (ref 0.4–1.2)
GFR calc Af Amer: >60 mL/min (ref >60)
GFR calc non Af Amer: >60 mL/min (ref >60)
Sodium: 137 mEq/L (ref 135–145)

## 2010-05-10 ENCOUNTER — Inpatient Hospital Stay (HOSPITAL_COMMUNITY): Payer: Managed Care, Other (non HMO)

## 2010-05-10 DIAGNOSIS — C50919 Malignant neoplasm of unspecified site of unspecified female breast: Secondary | ICD-10-CM

## 2010-05-10 LAB — COMPREHENSIVE METABOLIC PANEL
Albumin: 3.4 g/dL — ABNORMAL LOW (ref 3.5–5.2)
Alkaline Phosphatase: 64 U/L (ref 39–117)
BUN: 16 mg/dL (ref 6–23)
CO2: 28 mEq/L (ref 19–32)
Chloride: 107 mEq/L (ref 96–112)
Creatinine, Ser: 0.95 mg/dL (ref 0.4–1.2)
GFR calc non Af Amer: >60 mL/min (ref >60)
Potassium: 3.8 mEq/L (ref 3.5–5.1)
Total Bilirubin: 0.5 mg/dL (ref 0.3–1.2)

## 2010-05-10 LAB — CBC
HCT: 34.5 % — ABNORMAL LOW (ref 36.0–46.0)
Hemoglobin: 9.9 g/dL — ABNORMAL LOW (ref 12.0–15.0)
Hemoglobin: 12 g/dL (ref 12.0–15.0)
MCV: 97.8 fL (ref 78.0–100.0)
Platelets: 216 10*3/uL (ref 150–400)
Platelets: 250 10*3/uL (ref 150–400)
RBC: 2.94 MIL/uL — ABNORMAL LOW (ref 3.87–5.11)
RBC: 3.53 MIL/uL — ABNORMAL LOW (ref 3.87–5.11)
WBC: 4.2 10*3/uL (ref 4.0–10.5)
WBC: 7.8 10*3/uL (ref 4.0–10.5)

## 2010-05-10 LAB — DIFFERENTIAL
Basophils Absolute: 0 10*3/uL (ref 0.0–0.1)
Eosinophils Absolute: 0 10*3/uL (ref 0.0–0.7)
Lymphocytes Relative: 22 % (ref 12–46)
Monocytes Relative: 5 % (ref 3–12)
Neutro Abs: 3.1 10*3/uL (ref 1.7–7.7)
Neutrophils Relative %: 73 % (ref 43–77)

## 2010-05-11 ENCOUNTER — Ambulatory Visit (HOSPITAL_COMMUNITY): Payer: Managed Care, Other (non HMO) | Attending: Oncology

## 2010-05-11 ENCOUNTER — Inpatient Hospital Stay (HOSPITAL_COMMUNITY): Payer: Managed Care, Other (non HMO)

## 2010-05-11 LAB — DIFFERENTIAL
Basophils Absolute: 0 10*3/uL (ref 0.0–0.1)
Basophils Relative: 0 % (ref 0–1)
Eosinophils Relative: 0 % (ref 0–5)
Lymphocytes Relative: 34 % (ref 12–46)
Monocytes Absolute: 0.3 10*3/uL (ref 0.1–1.0)
Neutro Abs: 2.4 10*3/uL (ref 1.7–7.7)

## 2010-05-11 LAB — CBC
HCT: 26.8 % — ABNORMAL LOW (ref 36.0–46.0)
Hemoglobin: 8.8 g/dL — ABNORMAL LOW (ref 12.0–15.0)
MCHC: 32.8 g/dL (ref 30.0–36.0)
RDW: 16.8 % — ABNORMAL HIGH (ref 11.5–15.5)
WBC: 4.1 10*3/uL (ref 4.0–10.5)

## 2010-05-11 LAB — TECHNOLOGIST SMEAR REVIEW

## 2010-05-11 NOTE — H&P (Signed)
NAMEJANASHA, BARKALOW NO.:  [id number]  MEDICAL RECORD NO.:  [id number]           PATIENT TYPE:  E  LOCATION:  WLED                         FACILITY:  [facility]  PHYSICIAN:  Sharlet Salina T. Deanglo Hissong, M.D.DATE OF BIRTH:  1960/04/13  DATE OF ADMISSION:  05/08/2010 DATE OF DISCHARGE:                             HISTORY & PHYSICAL   CHIEF COMPLAINT:  Abdominal pain and nausea.  HISTORY:  Ms. Medico is an unfortunate 50 year old female with a long history of Crohn's disease.  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 The patient is currently undergoing neoadjuvant chemotherapy under the direction Dr. Drue Second.  Her chemotherapy is scheduled to last into April.  She last received a dose of Herceptin 3 days ago.  The patient has a long history of Crohn disease involving the terminal ileum which has been followed by Dr. Ewing Schlein.  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  The patient was hospitalized in December 2011 with a flare of Crohn disease that resolved with medical therapy.  She has had several hospitalizations over the past year for this.  She states that as far as her abdomen and GI tract, she was doing reasonably well in the last couple of months.  She had been placed on prednisone 40 mg a day by Dr. Ewing Schlein approximally January of this year after her recent hospitalization.  The patient states however that about 3 days ago she had the gradual onset of intermittent sharp abdominal pain.  She describes shooting or stabbing pain in her mid upper abdomen radiating somewhat towards her back.  This occurs about every 5 minutes or so.  It has been associated with nausea but no vomiting.  The patient does have a descending diverting colostomy and states her colostomy output has been relatively normal.  She has not had any fever  or chills.  No urinary symptoms.  She has chronic drainage from her vagina which is unchanged.  She presented to the [facility] Emergency Room today due to the persistent abdominal pain and nausea.  PAST MEDICAL HISTORY:  Surgery is significant for attempted rectovaginal fistula repair by Dr. Stanford Breed here in [place] and subsequent diverting descending colostomy.  She then had attempted repair of rectovaginal fistula last year by Dr. Candelaria Celeste at [facility] which apparently again failed.  She has had Port-A-Cath placement.  Medically, she is followed for history of depression, hypothyroidism, Crohn disease, and cancer of the breast and on neoadjuvant therapy as above.  CURRENT MEDICATIONS:  Prednisone 40 mg a day, Synthroid 200 mcg a day, and chemotherapy regimen  ALLERGIES:  No known drug allergies.  SOCIAL HISTORY:  She is single.  No cigarettes or alcohol.  Not employed.  FAMILY HISTORY:  Positive for Crohn disease in her sister.  There is no history of breast cancer.  REVIEW OF SYSTEMS:  GENERAL:  Her weight has been relatively stable.  No fever or chills.  HEENT: Denies vision, hearing, swallowing problems. RESPIRATORY:  No shortness of breath, cough, wheezing.  CARDIAC:  No chest pain, palpitations, history of heart disease.  BREASTS:  Recent diagnosis of right breast cancer as above.  She has clinically had excellent response of her breast mass to chemo.  PET scan was negative for metastatic disease prior to start of treatment.  ABDOMEN/GI:  As above.  GU:  She has chronic vaginal drainage gas and occasional fecalith discharge.  EXTREMITIES:  She has had some lower extremity swelling in recent weeks but not currently.  NEURO:  History of headaches.  No numbness, weakness, syncope.  PHYSICAL EXAMINATION:  VITAL SIGNS:  Temperature is 97.6, heart rate 106, blood pressure 107/71, respirations 14. GENERAL:  In general, she is somewhat thin, chronically ill  appearing female, in no acute distress. SKIN:  Dark complexion.  No rash or infection. HEENT:  No palpable mass or thyromegaly.  Sclerae nonicteric.  Pupils reactive.  Oropharynx clear. LYMPH NODES:  No cervical, supraclavicular, axillary, or inguinal nodes palpable. LUNGS:  Clear without wheeze.  No increased work of breathing. CARDIAC:  Mild tachycardia.  No murmurs.  Rhythm is regular.  Peripheral pulses intact.  No edema or JVD. ABDOMEN:  There is a healed midline incision without hernias.  Colostomy present in the left lower quadrant with stool and gas in the bag.  Her abdomen is not particularly distended.  Bowel sounds are present. Abdomen is soft with minimal upper abdominal tenderness.  I cannot feel any mass or organomegaly. GU EXAM:  Deferred. EXTREMITIES:  No swelling deformity. NEUROLOGIC:  She is alert and fully oriented.  Motor and sensory exams are grossly normal.  LABORATORY DATA:  Currently only lab back is CBC showing a white count of 8.8, hemoglobin 15.7.  Chest x-ray, Port-A-Cath in place, no active lung disease.  Flat and upright abdomen shows some markedly dilated loops of small bowel in the left abdomen consistent with high-grade small bowel obstruction.  ASSESSMENT AND PLAN:  Unfortunate 50 year old female with long history of Crohn disease and recent admissions for flare-ups.  These appear to be worsening in frequency and she now presents with high-grade obstruction likely secondary to her terminal ileal Crohn disease.  She unfortunately is currently undergoing neoadjuvant chemotherapy for breast cancer which of course complicates her GI care.  She has been on oral prednisone at home.  She has a colostomy due to rectovaginal fistula.  Her exam is relatively benign without tenderness.  Her pain is intermittent.  Her colostomy continues to function.  I do not believe that she needs emergency surgery at the time of admission.  The patient will be admitted  and placed on bowel rest.  We will order a CT scan of the abdomen and pelvis to try to further delineate the degree and etiology of her obstruction.  Oncology and GI Medicine will be consulted.  We will continue steroids IV.     Lorne Skeens. Caryle Helgeson, M.D.     Tory Emerald  D:  05/08/2010  T:  05/08/2010  Job:  [id number]  Electronically Signed by Glenna Fellows M.D. on 05/11/2010 11:51:17 AM

## 2010-05-12 LAB — DIFFERENTIAL
Basophils Absolute: 0 10*3/uL (ref 0.0–0.1)
Eosinophils Absolute: 0 10*3/uL (ref 0.0–0.7)
Lymphocytes Relative: 27 % (ref 12–46)
Monocytes Absolute: 0.3 10*3/uL (ref 0.1–1.0)
Neutrophils Relative %: 67 % (ref 43–77)

## 2010-05-12 LAB — CBC
Platelets: 179 10*3/uL (ref 150–400)
RBC: 2.76 MIL/uL — ABNORMAL LOW (ref 3.87–5.11)
RDW: 16.5 % — ABNORMAL HIGH (ref 11.5–15.5)
WBC: 4.4 10*3/uL (ref 4.0–10.5)

## 2010-05-13 ENCOUNTER — Inpatient Hospital Stay (HOSPITAL_COMMUNITY): Payer: Managed Care, Other (non HMO)

## 2010-05-13 DIAGNOSIS — K56609 Unspecified intestinal obstruction, unspecified as to partial versus complete obstruction: Secondary | ICD-10-CM

## 2010-05-13 DIAGNOSIS — K5090 Crohn's disease, unspecified, without complications: Secondary | ICD-10-CM

## 2010-05-13 DIAGNOSIS — C50919 Malignant neoplasm of unspecified site of unspecified female breast: Secondary | ICD-10-CM

## 2010-05-13 LAB — DIFFERENTIAL
Basophils Absolute: 0 10*3/uL (ref 0.0–0.1)
Eosinophils Relative: 0 % (ref 0–5)
Lymphocytes Relative: 26 % (ref 12–46)
Lymphs Abs: 1.2 10*3/uL (ref 0.7–4.0)
Monocytes Relative: 7 % (ref 3–12)
Neutrophils Relative %: 67 % (ref 43–77)

## 2010-05-13 LAB — CBC
Hemoglobin: 9 g/dL — ABNORMAL LOW (ref 12.0–15.0)
MCHC: 33 g/dL (ref 30.0–36.0)
RBC: 2.62 MIL/uL — ABNORMAL LOW (ref 3.87–5.11)
WBC: 4.7 10*3/uL (ref 4.0–10.5)

## 2010-05-13 LAB — COMPREHENSIVE METABOLIC PANEL
ALT: 12 U/L (ref 0–35)
AST: 14 U/L (ref 0–37)
Alkaline Phosphatase: 55 U/L (ref 39–117)
CO2: 28 mEq/L (ref 19–32)
Calcium: 7.2 mg/dL — ABNORMAL LOW (ref 8.4–10.5)
GFR calc Af Amer: >60 mL/min (ref >60)
GFR calc non Af Amer: >60 mL/min (ref >60)
Potassium: 3.5 mEq/L (ref 3.5–5.1)
Sodium: 141 mEq/L (ref 135–145)

## 2010-05-14 LAB — CBC
Hemoglobin: 9.3 g/dL — ABNORMAL LOW (ref 12.0–15.0)
MCH: 35.5 pg — ABNORMAL HIGH (ref 26.0–34.0)
MCHC: 33.8 g/dL (ref 30.0–36.0)

## 2010-05-14 LAB — DIFFERENTIAL
Basophils Absolute: 0 10*3/uL (ref 0.0–0.1)
Eosinophils Relative: 0 % (ref 0–5)
Lymphocytes Relative: 22 % (ref 12–46)
Lymphs Abs: 1.2 10*3/uL (ref 0.7–4.0)
Monocytes Absolute: 0.4 10*3/uL (ref 0.1–1.0)

## 2010-05-17 ENCOUNTER — Other Ambulatory Visit: Payer: Self-pay | Admitting: Oncology

## 2010-05-17 DIAGNOSIS — C50919 Malignant neoplasm of unspecified site of unspecified female breast: Secondary | ICD-10-CM

## 2010-05-18 NOTE — Consult Note (Signed)
  NAME:  [name], [name] NO.:  [id number]  MEDICAL RECORD NO.:  [id number]           PATIENT TYPE:  I  LOCATION:  1524                         FACILITY:  [facility]  PHYSICIAN:  Graylin Shiver, M.D.   DATE OF BIRTH:  04-23-1960  DATE OF CONSULTATION:  05/09/2010 DATE OF DISCHARGE:                                CONSULTATION   REASON FOR CONSULTATION:  The patient is a 50 year old Caucasian female with a history of Crohn disease, which she has had for the past 18 years.  She has a history of rectovaginal fistula in the past with attempted repair, which failed and she therefore has had a descending diverting colostomy.  The patient was admitted to the hospital here at [facility] yesterday by Dr. Johna Sheriff from the surgical service because of abdominal pain, which the patient began to experience.  A CT scan of the abdomen was done, which showed small bowel obstruction.  There was also 2 small bowel fistulae suspected.  The patient was started on IV steroids and today is feeling better.  She states that her colostomy has been functioning fine.  The patient has been on Pentasa and also in February of this year because of her Crohn disease was started on prednisone.  Three weeks ago, she was started on Humira and received her initial 4 injections of the Humira.  Her primary gastroenterologist is Dr. Vida Rigger, who she follows up with in the office.  The patient has been having intermittent diarrhea, which will last a day or 2 and then gets better according to her.  The patient also has a history of breast cancer and is undergoing chemotherapy with Dr. Thomasena Edis.  ALLERGIES:  None known.  SOCIAL HISTORY:  She does not smoke or drink.  PHYSICAL EXAMINATION:  GENERAL:  She does not appear in any acute distress.  She is alert and oriented, nonicteric. HEART:  Regular rhythm.  No murmurs. LUNGS:  Clear. ABDOMEN:  Colostomy is present.  Soft, nondistended, and nontender  at this time.  IMPRESSION: 1. Crohn disease. 2. Small bowel obstruction. 3. 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 4. History of breast cancer.  PLAN:  At the present time, she seems to be doing better with the addition of IV steroids and feels much better today than she did yesterday.  I would recommend continuing the IV steroids and following her clinically at this time and determine whether or not surgery is going to be needed in the next few days.          ______________________________ Graylin Shiver, M.D.     SFG/MEDQ  D:  05/09/2010  T:  05/10/2010  Job:  [id number]  cc:   Lorne Skeens. Hoxworth, M.D. [address], [address] [place] [address]  Petra Kuba, M.D. Fax: [phone]  Drue Second, M.D. Fax: [phone]  Electronically Signed by Herbert Moors MD on 05/18/2010 04:39:17 PM

## 2010-05-19 ENCOUNTER — Other Ambulatory Visit: Payer: Self-pay | Admitting: Oncology

## 2010-05-19 ENCOUNTER — Encounter (HOSPITAL_BASED_OUTPATIENT_CLINIC_OR_DEPARTMENT_OTHER): Payer: Managed Care, Other (non HMO) | Admitting: Oncology

## 2010-05-19 DIAGNOSIS — C50419 Malignant neoplasm of upper-outer quadrant of unspecified female breast: Secondary | ICD-10-CM

## 2010-05-19 DIAGNOSIS — C50919 Malignant neoplasm of unspecified site of unspecified female breast: Secondary | ICD-10-CM

## 2010-05-19 DIAGNOSIS — C50911 Malignant neoplasm of unspecified site of right female breast: Secondary | ICD-10-CM

## 2010-05-19 DIAGNOSIS — K5090 Crohn's disease, unspecified, without complications: Secondary | ICD-10-CM

## 2010-05-19 DIAGNOSIS — Z5112 Encounter for antineoplastic immunotherapy: Secondary | ICD-10-CM

## 2010-05-19 LAB — COMPREHENSIVE METABOLIC PANEL
ALT: 33 U/L (ref 0–35)
AST: 20 U/L (ref 0–37)
Alkaline Phosphatase: 60 U/L (ref 39–117)
Sodium: 145 mEq/L (ref 135–145)
Total Bilirubin: 0.4 mg/dL (ref 0.3–1.2)
Total Protein: 5 g/dL — ABNORMAL LOW (ref 6.0–8.3)

## 2010-05-19 LAB — CBC WITH DIFFERENTIAL/PLATELET
BASO%: 0.1 % (ref 0.0–2.0)
LYMPH%: 7.7 % — ABNORMAL LOW (ref 14.0–49.7)
MCHC: 33.7 g/dL (ref 31.5–36.0)
MONO#: 0.1 10*3/uL (ref 0.1–0.9)
MONO%: 1.5 % (ref 0.0–14.0)
Platelets: 258 10*3/uL (ref 145–400)
RBC: 2.88 10*6/uL — ABNORMAL LOW (ref 3.70–5.45)
WBC: 8.1 10*3/uL (ref 3.9–10.3)

## 2010-05-22 ENCOUNTER — Ambulatory Visit
Admission: RE | Admit: 2010-05-22 | Discharge: 2010-05-22 | Disposition: A | Discharge status: Home / Self Care | Payer: Managed Care, Other (non HMO) | Source: Ambulatory Visit | Attending: Oncology | Admitting: Oncology

## 2010-05-22 DIAGNOSIS — C50911 Malignant neoplasm of unspecified site of right female breast: Secondary | ICD-10-CM

## 2010-05-22 MED ORDER — GADOBENATE DIMEGLUMINE 529 MG/ML IV SOLN
13.0000 mL | Freq: Once | INTRAVENOUS | Status: AC | PRN
  Administered 2010-05-22: 13 mL via INTRAVENOUS

## 2010-05-24 ENCOUNTER — Ambulatory Visit
Admission: RE | Admit: 2010-05-24 | Discharge: 2010-05-24 | Disposition: A | Discharge status: Home / Self Care | Payer: Managed Care, Other (non HMO) | Source: Ambulatory Visit | Attending: Oncology | Admitting: Oncology

## 2010-05-24 ENCOUNTER — Other Ambulatory Visit: Payer: Self-pay | Admitting: Oncology

## 2010-05-24 DIAGNOSIS — R928 Other abnormal and inconclusive findings on diagnostic imaging of breast: Secondary | ICD-10-CM

## 2010-05-25 ENCOUNTER — Other Ambulatory Visit: Payer: Managed Care, Other (non HMO)

## 2010-05-25 LAB — URINALYSIS, MICROSCOPIC ONLY
Bilirubin Urine: NEGATIVE
Hgb urine dipstick: NEGATIVE
Ketones, ur: NEGATIVE mg/dL
Nitrite: NEGATIVE
Specific Gravity, Urine: 1.023 (ref 1.005–1.030)
Urobilinogen, UA: 0.2 mg/dL (ref 0.0–1.0)

## 2010-05-25 LAB — CBC
Hemoglobin: 11.5 g/dL — ABNORMAL LOW (ref 12.0–15.0)
MCHC: 34.5 g/dL (ref 30.0–36.0)
MCHC: 34.7 g/dL (ref 30.0–36.0)
MCHC: 35.2 g/dL (ref 30.0–36.0)
MCV: 96.8 fL (ref 78.0–100.0)
MCV: 96.9 fL (ref 78.0–100.0)
MCV: 98.2 fL (ref 78.0–100.0)
Platelets: 201 10*3/uL (ref 150–400)
Platelets: 243 10*3/uL (ref 150–400)
RBC: 3.35 MIL/uL — ABNORMAL LOW (ref 3.87–5.11)
RBC: 3.37 MIL/uL — ABNORMAL LOW (ref 3.87–5.11)
RBC: 3.43 MIL/uL — ABNORMAL LOW (ref 3.87–5.11)
RBC: 4.37 MIL/uL (ref 3.87–5.11)
WBC: 11.5 10*3/uL — ABNORMAL HIGH (ref 4.0–10.5)
WBC: 4.6 10*3/uL (ref 4.0–10.5)
WBC: 5.4 10*3/uL (ref 4.0–10.5)

## 2010-05-25 LAB — BASIC METABOLIC PANEL
CO2: 25 mEq/L (ref 19–32)
Chloride: 108 mEq/L (ref 96–112)
Creatinine, Ser: 0.77 mg/dL (ref 0.4–1.2)
GFR calc Af Amer: >60 mL/min (ref >60)

## 2010-05-25 LAB — COMPREHENSIVE METABOLIC PANEL
ALT: 13 U/L (ref 0–35)
ALT: UNDETERMINED U/L (ref 0–35)
AST: 13 U/L (ref 0–37)
AST: UNDETERMINED U/L (ref 0–37)
Albumin: 2.6 g/dL — ABNORMAL LOW (ref 3.5–5.2)
CO2: 28 mEq/L (ref 19–32)
Calcium: 8.1 mg/dL — ABNORMAL LOW (ref 8.4–10.5)
Chloride: 108 mEq/L (ref 96–112)
GFR calc Af Amer: >60 mL/min (ref >60)
GFR calc Af Amer: >60 mL/min (ref >60)
GFR calc non Af Amer: >60 mL/min (ref >60)
Glucose, Bld: 86 mg/dL (ref 70–99)
Potassium: 4.3 mEq/L (ref 3.5–5.1)
Sodium: 140 mEq/L (ref 135–145)
Sodium: 140 mEq/L (ref 135–145)
Total Bilirubin: 1.4 mg/dL — ABNORMAL HIGH (ref 0.3–1.2)
Total Protein: 5.2 g/dL — ABNORMAL LOW (ref 6.0–8.3)

## 2010-05-25 LAB — HEPATIC FUNCTION PANEL
AST: 23 U/L (ref 0–37)
Albumin: 3.6 g/dL (ref 3.5–5.2)
Total Bilirubin: 1.1 mg/dL (ref 0.3–1.2)
Total Protein: 6.5 g/dL (ref 6.0–8.3)

## 2010-05-25 LAB — URINALYSIS, ROUTINE W REFLEX MICROSCOPIC
Glucose, UA: NEGATIVE mg/dL
Hgb urine dipstick: NEGATIVE
Ketones, ur: 15 mg/dL — AB
Protein, ur: 30 mg/dL — AB

## 2010-05-25 LAB — DIFFERENTIAL
Basophils Relative: 0 % (ref 0–1)
Lymphs Abs: 0.6 10*3/uL — ABNORMAL LOW (ref 0.7–4.0)
Monocytes Relative: 3 % (ref 3–12)
Neutro Abs: 10.5 10*3/uL — ABNORMAL HIGH (ref 1.7–7.7)
Neutrophils Relative %: 91 % — ABNORMAL HIGH (ref 43–77)

## 2010-05-25 LAB — POCT I-STAT, CHEM 8
BUN: 13 mg/dL (ref 6–23)
Chloride: 102 mEq/L (ref 96–112)
Creatinine, Ser: 0.9 mg/dL (ref 0.4–1.2)
Sodium: 137 mEq/L (ref 135–145)
TCO2: 26 mmol/L (ref 0–100)

## 2010-05-25 LAB — URINE MICROSCOPIC-ADD ON

## 2010-05-25 LAB — SEDIMENTATION RATE: Sed Rate: 5 mm/hr (ref 0–22)

## 2010-05-25 LAB — C-REACTIVE PROTEIN: CRP: 1.6 mg/dL — ABNORMAL HIGH (ref <0.6)

## 2010-05-25 LAB — T4, FREE: Free T4: 0.76 ng/dL — ABNORMAL LOW (ref 0.80–1.80)

## 2010-05-25 LAB — TSH: TSH: 8.991 u[IU]/mL — ABNORMAL HIGH (ref 0.350–4.500)

## 2010-05-31 ENCOUNTER — Ambulatory Visit
Admission: RE | Admit: 2010-05-31 | Discharge: 2010-05-31 | Disposition: A | Discharge status: Home / Self Care | Payer: Managed Care, Other (non HMO) | Source: Ambulatory Visit | Attending: Oncology | Admitting: Oncology

## 2010-05-31 ENCOUNTER — Other Ambulatory Visit: Payer: Self-pay | Admitting: Diagnostic Radiology

## 2010-05-31 DIAGNOSIS — R928 Other abnormal and inconclusive findings on diagnostic imaging of breast: Secondary | ICD-10-CM

## 2010-05-31 MED ORDER — GADOBENATE DIMEGLUMINE 529 MG/ML IV SOLN
13.0000 mL | Freq: Once | INTRAVENOUS | Status: AC | PRN
  Administered 2010-05-31: 13 mL via INTRAVENOUS

## 2010-06-15 ENCOUNTER — Other Ambulatory Visit: Payer: Self-pay | Admitting: Oncology

## 2010-06-15 ENCOUNTER — Encounter (HOSPITAL_BASED_OUTPATIENT_CLINIC_OR_DEPARTMENT_OTHER): Payer: Managed Care, Other (non HMO) | Admitting: Oncology

## 2010-06-15 DIAGNOSIS — C50419 Malignant neoplasm of upper-outer quadrant of unspecified female breast: Secondary | ICD-10-CM

## 2010-06-15 DIAGNOSIS — K5090 Crohn's disease, unspecified, without complications: Secondary | ICD-10-CM

## 2010-06-15 DIAGNOSIS — Z5112 Encounter for antineoplastic immunotherapy: Secondary | ICD-10-CM

## 2010-06-15 LAB — CBC WITH DIFFERENTIAL/PLATELET
BASO%: 0.1 % (ref 0.0–2.0)
EOS%: 0 % (ref 0.0–7.0)
LYMPH%: 12.2 % — ABNORMAL LOW (ref 14.0–49.7)
MCH: 34 pg (ref 25.1–34.0)
MCHC: 34 g/dL (ref 31.5–36.0)
MCV: 100 fL (ref 79.5–101.0)
MONO%: 3.3 % (ref 0.0–14.0)
Platelets: 295 10*3/uL (ref 145–400)
RBC: 3.41 10*6/uL — ABNORMAL LOW (ref 3.70–5.45)
WBC: 6.8 10*3/uL (ref 3.9–10.3)

## 2010-06-15 LAB — COMPREHENSIVE METABOLIC PANEL
ALT: 18 U/L (ref 0–35)
Alkaline Phosphatase: 60 U/L (ref 39–117)
Sodium: 140 mEq/L (ref 135–145)
Total Bilirubin: 0.2 mg/dL — ABNORMAL LOW (ref 0.3–1.2)
Total Protein: 5.8 g/dL — ABNORMAL LOW (ref 6.0–8.3)

## 2010-06-17 ENCOUNTER — Encounter (HOSPITAL_BASED_OUTPATIENT_CLINIC_OR_DEPARTMENT_OTHER): Payer: Managed Care, Other (non HMO) | Admitting: Oncology

## 2010-06-17 DIAGNOSIS — Z5112 Encounter for antineoplastic immunotherapy: Secondary | ICD-10-CM

## 2010-06-17 DIAGNOSIS — C50419 Malignant neoplasm of upper-outer quadrant of unspecified female breast: Secondary | ICD-10-CM

## 2010-06-19 NOTE — Discharge Summary (Signed)
  [name], [name] NO.:  [id number]  MEDICAL RECORD NO.:  [id number]           PATIENT TYPE:  I  LOCATION:  1524                         FACILITY:  [facility]  PHYSICIAN:  Velora Heckler, MD      DATE OF BIRTH:  01-19-1961  DATE OF ADMISSION:  05/08/2010 DATE OF DISCHARGE:  05/14/2010                              DISCHARGE SUMMARY   HISTORY OF PRESENT ILLNESS:  [name] is a 50 year old female with a long history of Crohn disease.  She has had previous abdominal surgery including attempted rectovaginal fistula repair and subsequent diverting colostomy and she had a 2nd attempted repair of rectovaginal fistula at [facility] which again failed.  She has concomitant breast cancer and is currently getting neoadjuvant therapy at present time.  She presented with complaint of abdominal pain and appeared to have evidence of at least partial small bowel obstruction, probably secondary to a Crohn flare.  After being evaluated by Dr. Johna Sheriff on March 24th, decision was made to admit the patient for ongoing management.  SUMMARY OF HOSPITAL COURSE:  The patient was admitted on May 08, 2010, was placed n.p.o. status and bowel rest.  She was consulted by Oncology and Gastroenterology for inpatient management of her ongoing issues. Gradually, the patient showed improvement in her symptoms such that she was eventually started on clear liquid diet and was gradually advanced from clear liquids to thicker liquids and eventually solids.  She eventually showed resolution of her partial bowel obstruction secondary to Crohn flare and it was determined that she would be stable for discharge home on May 14, 2010.  She was tolerating regular diet.  Her oncological and gastrointestinal followup were made by the appropriate services.  She is determined to be stable for discharge.  DISCHARGE DIAGNOSES: 1. Partial small bowel obstruction secondary to Crohn flare. 2. Ongoing breast  cancer.  DISCHARGE MEDICATIONS:  The patient will resume home medications and therapies as directed and follow up in our clinic on as-needed basis for recurrent obstructive symptoms.     Brayton El, PA-C   ______________________________ Velora Heckler, MD    KB/MEDQ  D:  06/07/2010  T:  06/07/2010  Job:  [id number]  Electronically Signed by Brayton El  on 06/17/2010 09:04:23 AM Electronically Signed by Darnell Level MD on 06/19/2010 12:39:07 PM

## 2010-06-29 NOTE — Discharge Summary (Signed)
[name], [name]                   ACCOUNT NO.:  [id number]   MEDICAL RECORD NO.:  [id number]          PATIENT TYPE:  INP   LOCATION:  5015                         FACILITY:  [facility]   PHYSICIAN:  Hind I Elsaid, MD      DATE OF BIRTH:  08-17-1960   DATE OF ADMISSION:  07/09/2008  DATE OF DISCHARGE:  07/12/2008                               DISCHARGE SUMMARY   DISCHARGE DIAGNOSES:  1. Chronic ileus.  2. Hypokalemia.  3. History of Crohn disease.  4. Uncontrolled hypothyroidism.  5. History of depression.  6. History of cervical radiculopathy.   DISCHARGE MEDICATIONS:  1. Synthroid 175 mcg daily.  2. Wellbutrin 300 mg p.o. daily.  3. Flexeril 5 mg p.o. t.i.d. p.r.n.   CONSULTATION:  None.   PROCEDURES:  1. CT of abdomen and pelvis, distal small bowel loop with 3 areas of      narrowing, which may represent stricture from prior Crohn disease.      Active Crohn's cannot be completely excluded, although there is no      extra chronic findings to suggest such.  This stricture appeared to      be contributing to small bowel obstruction, small amount of free      fluid in the pelvis without discrete abscess.  2. Abdominal x-ray, slight improvement, small bowel distention.  3. Abdominal x-ray, improving partial small bowel obstruction, no free      intraperitoneal gas.   HISTORY OF PRESENT ILLNESS:  This is a 50 year old female admitted with  chief complaint of abdominal pain associated with 1 episode of emesis.  The patient has a previous history of small bowel obstruction.  The  patient also has a history of Crohn disease, being followed up Dr. Ewing Schlein  and status post colonoscopy in 2005, which did show hemorrhoid and small  pseudopolyp structure and ulcerative anastomosis status post biopsy.  The patient admitted, kept n.p.o. with IV fluid.  Gradually upgrading  her diet to clear liquid diet.  We did not feel the patient need any  surgical intervention at this time and during  hospital stay, the patient  felt well, tolerated regular diet.  On Jul 12, 2008, the patient  tolerated regular diet, denies any nausea and vomiting, and had good  bowel movement.  The  patient admitted would like to be discharged home.  The patient was seen  and examined and her abdominal examination completely benign.  Repeat  abdominal x-ray did not show any evidence of obstruction.  At that time,  we felt the patient was medically stable to be discharged home.      Hind Bosie Helper, MD  Electronically Signed     Hind Bosie Helper, MD  Electronically Signed    HIE/MEDQ  D:  08/13/2008  T:  08/14/2008  Job:  [id number]

## 2010-06-29 NOTE — H&P (Signed)
[name] NO.:  [id number]   MEDICAL RECORD NO.:  [id number]          PATIENT TYPE:  INP   LOCATION:  5151                         FACILITY:  [facility]   PHYSICIAN:  Renee Ramus, MD       DATE OF BIRTH:  12/21/1960   DATE OF ADMISSION:  07/09/2008  DATE OF DISCHARGE:                              HISTORY & PHYSICAL   HISTORY OF PRESENT ILLNESS:  The patient is a 50 year old female with  chief complaint of abdominal pain, it occurred acutely this a.m.  The  patient had one episode of emesis.  Denies chest pain, shortness of  breath, PND, orthopnea.  Denies fevers, chills, or night sweats.  The  patient did have a previous episode of small bowel obstruction  approximately 15 years ago, but none in the intervening.  The patient  denies exacerbating or relieving factors.  She denies diarrhea, but she  does say I am dizzy.   PAST MEDICAL HISTORY:  1. Crohn disease.  2. Hypothyroid.  3. Depression.  4. Status post rectovaginal fistula repair.  5. 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  6. Status post partial colectomy.   SOCIAL HISTORY:  The patient denies tobacco use, reports rare alcohol  use, and is married, lives with her husband.   FAMILY HISTORY:  Not available.   REVIEW OF SYSTEMS:  All other comprehensive review systems are negative.   MEDICATIONS:  The patient has no known drug allergies.   CURRENT MEDICATIONS:  1. Synthroid 150 mcg p.o. daily.  2. Wellbutrin 300 mg p.o. daily.  3. Flexeril 5 mg p.o. t.i.d. p.r.n. arm pain.   PHYSICAL EXAMINATION:  GENERAL:  This is a well-developed and well-  nourished white female, currently in no apparent distress.  VITAL SIGNS:  Blood pressure 101/78, heart rate 79, respiratory rate 15,  and temperature 97.4.  HEENT:  No jugular venous distention or lymphadenopathy.  Oropharynx is  clear.  Mucous membrane is pink and moist.  TMs clear bilaterally.  Pupils equal and reactive to light  and accommodation.  Extraocular  muscles are intact.  CARDIOVASCULAR:  Regular rate and rhythm without murmurs, rubs, or  gallops.  PULMONARY:  Lungs are clear to auscultation bilaterally.  ABDOMEN:  Soft, nontender, and nondistended without hepatosplenomegaly.  Bowel sounds are present.  She has no rebound or guarding.  EXTREMITIES:  She has no clubbing, cyanosis, or edema.  She has good  peripheral pulses, dorsalis pedis, and radial arteries.  She is able to  move all extremities.  NEUROLOGIC:  Cranial nerves II through XII are grossly intact.  She has  no focal neurological deficits.   LABORATORIES:  White count 11.5, H and H 15.6 and 46, MCV 97, and  platelets 243.  Sodium 137, potassium 4.6, chloride 102, bicarb 24,  glucose 13, creatinine 0.9, and glucose 118.  Urine pregnancy test is  negative.  UA shows specific gravity of 1.042 with 15 ketones, but no  evidence of pus or infection.   STUDIES:  CT of the abdomen and pelvis shows dilated bowel consistent  with small-bowel obstruction with question of mild acute Crohn disease.   ASSESSMENT AND PLAN:  1. Small-bowel obstruction.  We will make the patient n.p.o. except      for ice chips.  We will give her IV fluids.  The patient has      declined a NG tube at this time.  We will defer surgical consult      for 24-48 hours to see if this resolves spontaneously, if not, we      would consider consulting surgery.  2. Crohn disease per Gastrointestinal.  We will observe her if her CRP      greatly increases or her white blood cell count increases, or she      develops diarrhea consistent with a Crohn exacerbation.  We will      obtain formal Gastrointestinal consult.  3. Depression.  We will continue selective serotonin reuptake      inhibitor when the patient is able to take p.o.  We will defer for      now.  4. Hypothyroid.  We will check TSH and a free T4, and place the      patient on IV levothyroxine.  5. Cervical  radiculopathy.  We will treat for pain, both of bowel      obstruction and also for potential radiculopathy pain.  We will      obviously hold her Flexeril at this time.   DISPOSITION:  The patient is full code.  H and P was constructed by  reviewing past medical history conferring with emergency medical room  physician and reviewing the emergency room medical record.   TIME SPENT:  One hour.      Renee Ramus, MD  Electronically Signed     JF/MEDQ  D:  07/09/2008  T:  07/10/2008  Job:  [id number]   cc:   Ace Gins, MD  Petra Kuba, M.D.

## 2010-07-02 NOTE — Op Note (Signed)
NAME:  [name], [name]                             ACCOUNT NO.:  [id number]   MEDICAL RECORD NO.:  [id number]                   PATIENT TYPE:  AMB   LOCATION:  ENDO                                 FACILITY:  [facility]   PHYSICIAN:  Petra Kuba, M.D.                 DATE OF BIRTH:  [birth year]   DATE OF PROCEDURE:  09/03/2003  DATE OF DISCHARGE:                                 OPERATIVE REPORT   PROCEDURE:  EGD.   INDICATION:  Crohn's with upper tract symptoms.  Consent was signed after  risks, benefits, methods, options thoroughly discussed in the office.   MEDICINES USED ADDITIONALLY:  1. Demerol 40.  2. Versed 4.   DESCRIPTION OF PROCEDURE:  The video endoscope was inserted by direct  vision.  The esophagus was normal.  Scope passed into the stomach, advanced  through a normal antrum and normal pylorus into the duodenal bulb, pertinent  for a minimal amount of bulbitis, and around the C-loop to a normal second  portion of the duodenum.  The scope was then slowly withdrawn back to the  bulb, and a good look there ruled out ulcers and confirmed the minimal  bulbitis.  The scope was withdrawn back to the stomach, and retroflexed.  Cardia, fundus, angularis, lesser and greater curve were all normal on  retroflexed visualization.  Straight visualization of the stomach did not  reveal any additional findings.  The scope was reinserted into the second  portion of the duodenum.  Again, no additional findings were seen.  Air was  suctioned and the scope slowly withdrawn.  On slow withdrawal, a small  hiatal hernia was confirmed, but the esophagus was normal.  The scope was  removed.  The patient tolerated the procedure well.  There was no obvious  immediate complication.   ENDOSCOPIC DIAGNOSES:  1. Small hiatal hernia.  2. Minimal bulbitis.  3. Otherwise normal EGD.   PLAN:  Continue work-up with a small-bowel follow-through, particularly to  evaluate her terminal ileum to decide on  surgical options versus 6-MP  Remicade, etc., and okay to use pump inhibitors, either prescription or  Prilosec.                                               Petra Kuba, M.D.    MEM/MEDQ  D:  09/03/2003  T:  09/03/2003  Job:  [id number]   cc:   Marjory Lies, M.D.  [address]  [place]  [place] [address]  Fax: [phone]   Valetta Fuller, M.D.  [address]  [place]  [place] [address]  Fax: [phone]

## 2010-07-02 NOTE — Consult Note (Signed)
[facility]  Patient:    [name], [name] Visit Number: [id number] MRN: [id number]          Service Type: GON Location: GYN Attending Physician:  Jeannette Corpus Dictated by:   Rande Brunt. Clarke-Pearson, M.D. Proc. Date: 03/07/01 Admit Date:  03/07/2001   CC:         Petra Kuba, M.D.  Trevor Iha, M.D.  Telford Nab, R.N.   Consultation Report  HISTORY OF PRESENT ILLNESS:  A 50 year old white female with longstanding Crohns disease.  She returns today for a reevaluation of a rectovaginal fistula.  The rectovaginal fistula was initially recognized this summer and documented in August at the time of exam under anesthesia and proctoscopy. This is a very small fistula which was not bothering the patient very much and in face, was unable to actually pass a wire probe through the fistulous tract under anesthesia.  Subsequently, the patient has been treated with Remicade in August and September and November, and she reported the fistula became somewhat better.  At the present time, the patient has actually several weeks where she does not have any evidence of leakage from the fistula, and then she may have 2-3 days where she leaks air or a small amount of stool.  Overall, she is not terribly bothered by the fistula at the present time.  Recently, the patient has had colonoscopy by Dr. Ewing Schlein, which appears to have identified the fistula along with some minimal colon erosions, pseudopolyps, and some stricturing at her coloileal anastomosis and some ulceration in that region.  The patient has also had an upper GI series with small-bowel follow-through on January 16, 2002, which shows that the majority of the ileum and right colon have been resected.  There was no evidence of persistent stenosis.  The patient reports that she has 3-4 loose bowel movements daily.  She is not currently taking any medication for the Crohns disease.  She  comes accompanied by her sister today.  REVIEW OF SYSTEMS:  Otherwise negative.  FAMILY HISTORY/SOCIAL HISTORY:  Reviewed and unchanged.  PHYSICAL EXAMINATION:  VITAL SIGNS:  Weight 136 pounds.  ABDOMEN:  Soft, nontender, no masses, hepatosplenomegaly, ascites, or hernias are noted.  PELVIC:  EG/BUS normal.  The vagina appears normal.  She has menstrual blood in the vagina.  The cervix is normal.  Uterus is anterior and normal in shape, size, and consistency.  Rectovaginal exam confirms.  Careful search for the fistula today is nonrevealing.  She does have thickening and scarring in the posterior vaginal wall and anterior rectum at 12 oclock between the anal sphincter and approximately 3-4 cm proximally.  This is an area of prior surgery and I believe represents scar tissue.  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  The patient and I had a lengthy discussion regarding the pros and cons of surgical repair versus observation.  At the end of that discussion, the patient is very amenable to the idea of not proceeding with any surgery unless her symptoms worsen.  She is given a new prescription for Garnette Scheuermann #28 (oral contraceptive).  I will contact Dr. Ewing Schlein, who is not available today, to see if he has any other input regarding these issues, otherwise will have the patient come back as needed. Dictated by:   Rande Brunt. Clarke-Pearson, M.D. Attending Physician:  Jeannette Corpus DD:  03/07/01 TD:  03/08/01 Job: [id number]

## 2010-07-02 NOTE — Procedures (Signed)
[facility]  Patient:    [name], [name] Visit Number: [id number] MRN: [id number]          Service Type: END Location: ENDO Attending Physician:  Nelda Marseille Dictated by:   Petra Kuba, M.D. Proc. Date: 03/01/01 Admit Date:  03/01/2001   CC:         Reuel Boom L. Clarke-Pearson, M.D.  Trevor Iha, M.D.   Procedure Report  PROCEDURE:  Colonoscopy with biopsy.  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  Want to reevaluate her disease.  Consent was signed prior to any premedicines given after risks, benefits, methods and options thoroughly discussed multiple times in the office.  MEDICATIONS:  Demerol 80, Versed 9.  DESCRIPTION OF PROCEDURE:  Rectal inspection was pertinent for small external hemorrhoids.  Digital exam was negative.  The video pediatric colonoscope was inserted and initially on retroflexion and anorectal pull-through, old scarring from the previous fistulae were seen but no active fistula.  However, at the end of the procedure and reevaluating the rectum, I believe we saw a fistula just next to a healed fistula scar, as mentioned above. Photodocumentation was obtained.  The scope was then straightened and easily advanced to the anastomosis.  There might have been a rare erosion in the colon and a rare pseudopolyp on insertion but no signs of significant active Crohns.  The anastomosis was inflamed.  It was a small bowel into colon side. The end of the colon was normal.  Carefully through an inflamed, ulcerated anastomosis, we were able to advance through a narrowed, approximately 10 cm segment of involved small bowel into a normal-appearing area of small bowel. No obvious other areas were seen proximally that were involved.  The scope was then slowly withdrawn, and multiple TI biopsies were obtained.  No obvious GI fistula was seen but with the significant inflammation and erythema  and ulcers, lesions and fistulae could have been missed.  Once back in the colon, lots of washing and suctioning was done for adequate visualization.  As mentioned above, there was a rare pseudopolyp and erosion, and random colon biopsies were obtained.  The rectum was back reevaluated once back in the rectum which confirmed the above-mentioned findings.  The scope was straightened and readvanced a short ways up the colon.  Air and water were suctioned.  The scope was removed.  The patient tolerated the procedure well. There was no obvious immediate complication.  ENDOSCOPIC DIAGNOSES: 1. Small hemorrhoids. 2. Probable tiny fistula right next to her healed fistula just above the    anorectal junction. 3. Minimal colon erosions and pseudopolyp status post biopsy. 4. Anastomosis and distal terminal ileum disease with some stricturing,    inflammation, erythema, and ulcers approximately 10 cm and less, status    post biopsy. 5. Otherwise within normal limits to the more proximal terminal ileum.  PLAN:  Await pathology.  Will need to rediscuss starting medications be it 6-MP or a 5-ASA product, and Dr. Stanford Breed to reevaluate regarding surgical options with consideration of fixing the fistula, possibly an ostomy bag, although I doubt she will let us, and possibly resecting the active segment in the anastomosis. Dictated by:   Petra Kuba, M.D. Attending Physician:  Nelda Marseille DD:  03/01/01 TD:  03/02/01 Job: [id number]

## 2010-07-08 ENCOUNTER — Encounter (HOSPITAL_COMMUNITY)
Admission: RE | Admit: 2010-07-08 | Discharge: 2010-07-08 | Disposition: A | Discharge status: Home / Self Care | Payer: Managed Care, Other (non HMO) | Source: Ambulatory Visit | Attending: Oncology | Admitting: Oncology

## 2010-07-08 ENCOUNTER — Encounter (HOSPITAL_BASED_OUTPATIENT_CLINIC_OR_DEPARTMENT_OTHER): Payer: Managed Care, Other (non HMO) | Admitting: Oncology

## 2010-07-08 ENCOUNTER — Other Ambulatory Visit: Payer: Self-pay | Admitting: Oncology

## 2010-07-08 DIAGNOSIS — D649 Anemia, unspecified: Secondary | ICD-10-CM | POA: Insufficient documentation

## 2010-07-08 DIAGNOSIS — C50419 Malignant neoplasm of upper-outer quadrant of unspecified female breast: Secondary | ICD-10-CM

## 2010-07-08 DIAGNOSIS — Z5112 Encounter for antineoplastic immunotherapy: Secondary | ICD-10-CM

## 2010-07-08 LAB — CBC WITH DIFFERENTIAL/PLATELET
BASO%: 0.2 % (ref 0.0–2.0)
Eosinophils Absolute: 0 10*3/uL (ref 0.0–0.5)
HCT: 25.1 % — ABNORMAL LOW (ref 34.8–46.6)
LYMPH%: 16.5 % (ref 14.0–49.7)
MCHC: 27.5 g/dL — ABNORMAL LOW (ref 31.5–36.0)
MCV: 102 fL — ABNORMAL HIGH (ref 79.5–101.0)
MONO%: 3.2 % (ref 0.0–14.0)
NEUT%: 80.1 % — ABNORMAL HIGH (ref 38.4–76.8)
Platelets: 281 10*3/uL (ref 145–400)
RBC: 2.46 10*6/uL — ABNORMAL LOW (ref 3.70–5.45)
nRBC: 1 % — ABNORMAL HIGH (ref 0–0)

## 2010-07-08 LAB — BASIC METABOLIC PANEL
CO2: 21 mEq/L (ref 19–32)
Chloride: 108 mEq/L (ref 96–112)
Creatinine, Ser: 0.94 mg/dL (ref 0.40–1.20)
Potassium: 3.7 mEq/L (ref 3.5–5.3)

## 2010-07-09 ENCOUNTER — Encounter (HOSPITAL_BASED_OUTPATIENT_CLINIC_OR_DEPARTMENT_OTHER): Payer: Managed Care, Other (non HMO) | Admitting: Oncology

## 2010-07-09 DIAGNOSIS — C50419 Malignant neoplasm of upper-outer quadrant of unspecified female breast: Secondary | ICD-10-CM

## 2010-07-10 LAB — CROSSMATCH: Unit division: 0

## 2010-07-23 ENCOUNTER — Encounter (HOSPITAL_COMMUNITY)
Admission: RE | Admit: 2010-07-23 | Discharge: 2010-07-23 | Disposition: A | Discharge status: Home / Self Care | Payer: Managed Care, Other (non HMO) | Source: Ambulatory Visit | Attending: General Surgery | Admitting: General Surgery

## 2010-07-23 LAB — CBC
HCT: 36.5 % (ref 36.0–46.0)
Hemoglobin: 11.1 g/dL — ABNORMAL LOW (ref 12.0–15.0)
MCH: 29 pg (ref 26.0–34.0)
MCHC: 30.4 g/dL (ref 30.0–36.0)
MCV: 95.3 fL (ref 78.0–100.0)
RBC: 3.83 MIL/uL — ABNORMAL LOW (ref 3.87–5.11)

## 2010-07-23 LAB — DIFFERENTIAL
Lymphocytes Relative: 15 % (ref 12–46)
Lymphs Abs: 1 10*3/uL (ref 0.7–4.0)
Monocytes Absolute: 0.5 10*3/uL (ref 0.1–1.0)
Monocytes Relative: 7 % (ref 3–12)
Neutro Abs: 5.1 10*3/uL (ref 1.7–7.7)
Neutrophils Relative %: 77 % (ref 43–77)

## 2010-07-23 LAB — COMPREHENSIVE METABOLIC PANEL
ALT: 13 U/L (ref 0–35)
BUN: 19 mg/dL (ref 6–23)
CO2: 31 mEq/L (ref 19–32)
Calcium: 9.2 mg/dL (ref 8.4–10.5)
GFR calc Af Amer: >60 mL/min (ref >60)
GFR calc non Af Amer: >60 mL/min (ref >60)
Glucose, Bld: 139 mg/dL — ABNORMAL HIGH (ref 70–99)
Total Protein: 5.4 g/dL — ABNORMAL LOW (ref 6.0–8.3)

## 2010-07-23 LAB — SURGICAL PCR SCREEN: MRSA, PCR: NEGATIVE

## 2010-07-27 ENCOUNTER — Inpatient Hospital Stay (HOSPITAL_COMMUNITY)
Admission: RE | Admit: 2010-07-27 | Discharge: 2010-07-29 | DRG: 581 | Disposition: A | Discharge status: Home / Self Care | Payer: Managed Care, Other (non HMO) | Source: Ambulatory Visit | Attending: General Surgery | Admitting: General Surgery

## 2010-07-27 ENCOUNTER — Other Ambulatory Visit (INDEPENDENT_AMBULATORY_CARE_PROVIDER_SITE_OTHER): Payer: Self-pay | Admitting: General Surgery

## 2010-07-27 DIAGNOSIS — K219 Gastro-esophageal reflux disease without esophagitis: Secondary | ICD-10-CM | POA: Diagnosis present

## 2010-07-27 DIAGNOSIS — C50919 Malignant neoplasm of unspecified site of unspecified female breast: Principal | ICD-10-CM | POA: Diagnosis present

## 2010-07-27 DIAGNOSIS — Z87891 Personal history of nicotine dependence: Secondary | ICD-10-CM

## 2010-08-02 NOTE — Op Note (Signed)
[name], [name] NO.:  [id number]  MEDICAL RECORD NO.:  [id number]  LOCATION:  5124                         FACILITY:  [facility]  PHYSICIAN:  Ollen Gross. Vernell Morgans, M.D. DATE OF BIRTH:  October 18, 1960  DATE OF PROCEDURE:  07/27/2010 DATE OF DISCHARGE:                              OPERATIVE REPORT   PREOPERATIVE DIAGNOSIS:  Right breast cancer with positive axillary nodes.  POSTOPERATIVE DIAGNOSIS:  Right breast cancer with positive axillary nodes.  PROCEDURE:  Bilateral mastectomies and right axillary lymph node dissection with separate reconstruction.  SURGEON:  Ollen Gross. Vernell Morgans, MD  ASSISTANT:  Currie Paris, MD  ANESTHESIA:  General endotracheal.  PROCEDURE IN DETAIL:  After informed consent was obtained, the patient was brought to the operating room, placed in supine position on the table.  After adequate induction of general anesthesia, the patient's chest, axillary, and breast area were all prepped with ChloraPrep, allowed to dry and then draped in usual sterile fashion.  Elliptical incisions were mapped out around the nipple-areolar complex to try to minimize the amount of skin taken and to maintain a clean cleavage area. Attention was first turned to the left breast.  The incision was made along the mapped outlines using a 10 blade knife.  This incision was carried down through the skin and subcutaneous tissue sharply with electrocautery until the breast tissue was entered.  Once this was accomplished, skin hooks were used to elevate the skin flaps anteriorly towards the ceiling.  Thin skin flaps were created sharply with a Harmonic blade.  This dissection was carried out circumferentially around the incision until the dissection reached the chest wall superiorly and inferiorly, and the dissection was carried into the axilla laterally until we met the latissimus muscle.  Once this was accomplished, then the breast was removed from the chest wall  with the pectoralis fascia.  This was done sharply with the electrocautery.  Once the breast was freed and removed, it was oriented with a stitch on the lateral aspect and sent to Pathology for further evaluation.  Hemostasis was achieved using Bovie electrocautery.  The wound was irrigated with copious amounts of saline.  The muscle looked healthy, and the skin flaps looked good.  Moist lap was placed in the cavity.  The implants were still intact.  The patient previously had bilateral implants placed below the muscle.  The left side was then covered with sterile towel. Attention was then turned to the right side.  Similar incision was made with the 10 blade knife.  This incision was carried down through the skin and subcutaneous tissue sharply with electrocautery.  Breast hooks were then used to elevate the skin flaps anteriorly towards the ceiling, and thin skin flaps were created circumferentially around the incision using the Harmonic blade until the dissection was carried all the way down to the chest wall superiorly and inferiorly.  Laterally, the dissection was carried down to the latissimus muscle laterally.  Once this was accomplished, the breast was then removed from the chest wall with the pectoralis fascia.  This was done sharply with the electrocautery.  We then entered the axilla laterally.  The  chest wall muscles medially were identified.  The latissimus muscle laterally and a right axillary vein superiorly were all identified.  Once this was accomplished, the lymph node tissue within these boundaries was teased away bluntly with a right-angle clamp several small vessels and lymphatics were controlled with clips.  We were able to identify the long thoracic nerve and the thoracodorsal neurovascular complex and spare these.  Once the nodes were freed then the breast tissue and nodes were sent on block together.  The breast was marked on the lateral aspect of the stitch.   Hemostasis was achieved using Bovie electrocautery.  The wound was irrigated copious amounts of saline.  The implants were intact, and the muscle appeared to be healthy.  The wound was then packed with a moistened sponge and covered with sterile towel. At this point, the patient was doing very well.  We turned the case over this point to Dr. Odis Luster for the reconstruction.  His portion will be dictated separately.  The patient was in stable condition.     Ollen Gross. Vernell Morgans, M.D.     PST/MEDQ  D:  07/27/2010  T:  07/28/2010  Job:  [id number]  Electronically Signed by Chevis Pretty III M.D. on 08/02/2010 07:32:52 AM

## 2010-08-09 ENCOUNTER — Other Ambulatory Visit: Payer: Self-pay | Admitting: Oncology

## 2010-08-09 ENCOUNTER — Encounter (HOSPITAL_BASED_OUTPATIENT_CLINIC_OR_DEPARTMENT_OTHER): Payer: Managed Care, Other (non HMO) | Admitting: Oncology

## 2010-08-09 DIAGNOSIS — C50419 Malignant neoplasm of upper-outer quadrant of unspecified female breast: Secondary | ICD-10-CM

## 2010-08-09 DIAGNOSIS — Z5111 Encounter for antineoplastic chemotherapy: Secondary | ICD-10-CM

## 2010-08-09 DIAGNOSIS — Z5112 Encounter for antineoplastic immunotherapy: Secondary | ICD-10-CM

## 2010-08-09 LAB — CBC WITH DIFFERENTIAL/PLATELET
BASO%: 0.4 % (ref 0.0–2.0)
Eosinophils Absolute: 0.1 10*3/uL (ref 0.0–0.5)
LYMPH%: 30 % (ref 14.0–49.7)
MCHC: 31.1 g/dL — ABNORMAL LOW (ref 31.5–36.0)
MCV: 88.7 fL (ref 79.5–101.0)
MONO#: 0.6 10*3/uL (ref 0.1–0.9)
MONO%: 8.2 % (ref 0.0–14.0)
NEUT#: 4.1 10*3/uL (ref 1.5–6.5)
Platelets: 403 10*3/uL — ABNORMAL HIGH (ref 145–400)
RBC: 3.19 10*6/uL — ABNORMAL LOW (ref 3.70–5.45)
RDW: 15.3 % — ABNORMAL HIGH (ref 11.2–14.5)
WBC: 6.8 10*3/uL (ref 3.9–10.3)

## 2010-08-09 LAB — COMPREHENSIVE METABOLIC PANEL
ALT: 11 U/L (ref 0–35)
Albumin: 2.5 g/dL — ABNORMAL LOW (ref 3.5–5.2)
Alkaline Phosphatase: 67 U/L (ref 39–117)
Potassium: 3.8 mEq/L (ref 3.5–5.3)
Sodium: 139 mEq/L (ref 135–145)
Total Bilirubin: 0.2 mg/dL — ABNORMAL LOW (ref 0.3–1.2)
Total Protein: 5.4 g/dL — ABNORMAL LOW (ref 6.0–8.3)

## 2010-08-11 ENCOUNTER — Encounter (INDEPENDENT_AMBULATORY_CARE_PROVIDER_SITE_OTHER): Payer: Self-pay | Admitting: General Surgery

## 2010-08-11 ENCOUNTER — Ambulatory Visit (INDEPENDENT_AMBULATORY_CARE_PROVIDER_SITE_OTHER): Payer: Managed Care, Other (non HMO) | Admitting: General Surgery

## 2010-08-11 DIAGNOSIS — K50813 Crohn's disease of both small and large intestine with fistula: Secondary | ICD-10-CM | POA: Insufficient documentation

## 2010-08-11 DIAGNOSIS — K5080 Crohn's disease of both small and large intestine without complications: Secondary | ICD-10-CM

## 2010-08-11 NOTE — Progress Notes (Signed)
Subjective:     Patient ID: [name], female   DOB: August 19, 1960, 50 y.o.   MRN: [id number]    There were no vitals taken for this visit.    HPI She is here to discuss her complicated Crohn's disease and possible operative management. He recently underwent bilateral mastectomies by Dr. Carolynne Edouard for right breast cancer. She is currently receiving herceptin therapy. He has a rectovaginal fistula from her Crohn's disease. He has had 2 attempted repairs that have failed. She has a diverting end colostomy. It prolapses at times. Was in the hospital in March for partial small bowel obstruction secondary to Crohn's disease involving the terminal ileum. She has recovered from that but intermittently feel some sharp cramping pains like she did before that admission. She has seen Dr. Sandrea Hammond recently and he is discussed putting her on Humira therapy when she finishes her chemotherapy.  Review of Systems Per HPI    Objective:   Physical Exam General: A well-developed female who appears to be depressed at times. Abdomen: Soft with well-healed midline scar and left-sided colostomy with a small amount of prolapse that is reducible.    Assessment:     Complicated Crohn's disease with rectovaginal fistula and now involvement of terminal ileum leading to partial small bowel obstruction requiring hospitalization in March of this year. In the presence of active Crohn's disease I do not think repair of her rectovaginal fistula would be successful. I agree with Dr. Leary Roca at that a trial of Humira or other medical Crohn's disease  therapy would be helpful. This was not felt to be wise  because of her need for herceptin treatment for her breast cancer and she continued to have partial obstructive symptoms, she may just require small bowel or resection.    Plan:    Dr. Ewing Schlein and I have discussed this. His going to call her and put her on some Pentasa possibly. See her back after her attempt at medical treatment if she  continues to be symptomatic.

## 2010-08-13 ENCOUNTER — Encounter (INDEPENDENT_AMBULATORY_CARE_PROVIDER_SITE_OTHER): Payer: Self-pay | Admitting: General Surgery

## 2010-08-16 ENCOUNTER — Encounter (INDEPENDENT_AMBULATORY_CARE_PROVIDER_SITE_OTHER): Payer: Self-pay | Admitting: General Surgery

## 2010-08-16 ENCOUNTER — Ambulatory Visit (INDEPENDENT_AMBULATORY_CARE_PROVIDER_SITE_OTHER): Payer: Managed Care, Other (non HMO) | Admitting: General Surgery

## 2010-08-16 VITALS — Temp 97.8°F | Wt 144.8 lb

## 2010-08-16 DIAGNOSIS — C50919 Malignant neoplasm of unspecified site of unspecified female breast: Secondary | ICD-10-CM

## 2010-08-16 DIAGNOSIS — K5090 Crohn's disease, unspecified, without complications: Secondary | ICD-10-CM

## 2010-08-16 NOTE — Progress Notes (Signed)
Subjective:     Patient ID: [name], female   DOB: 1960/11/02, 50 y.o.   MRN: [id number]    Temp(Src) 97.8 F (36.6 C) (Temporal)  Wt 144 lb 12.8 oz (65.681 kg)    HPI 50 yo wf who is 3 weeks out from bilateral mastectomies, right axillary dissection, and reconstruction by Dr. Odis Luster. Her post op course is complicated by some skin necrosis of the left superior flap. Otherwise no complaints.  Review of Systems     Objective:   Physical Exam Right chest wall looks good. Left chest wall ok. Superior flap has a silver dollar size area of skin necrosis but it is clean and dry.    Assessment:     Post op    Plan:     Continue local wound care to left skin flap. F/U with Dr. Odis Luster as scheduled and me in 3 weeks

## 2010-08-16 NOTE — Patient Instructions (Signed)
Continue local wound care to left breast F/U in 3-4 weeks

## 2010-08-23 NOTE — Op Note (Signed)
[name], CARLEY NO.:  [id number]  MEDICAL RECORD NO.:  [id number]  LOCATION:  5124                         FACILITY:  [facility]  PHYSICIAN:  Etter Sjogren, M.D.     DATE OF BIRTH:  [birth year]  DATE OF PROCEDURE:  07/27/2010 DATE OF DISCHARGE:                              OPERATIVE REPORT   PREOPERATIVE DIAGNOSIS:  Breast cancer.  POSTOPERATIVE DIAGNOSIS:  Breast cancer.  PROCEDURE PERFORMED: 1. Immediate breast reconstruction right breast with silicone gel     implant. 2. Placement of acellular dermal matrix (HD Flex) right chest for     breast reconstruction. 3. Immediate breast reconstruction, left breast. 4. Placement of acellular dermal matrix (HD Flex) for breast     reconstruction distinct procedure, left chest. 5. Remove breast implant bilateral  SURGEON:  Etter Sjogren, MD  ANESTHESIA:  General.  ESTIMATED BLOOD LOSS:  20 mL.  DRAINS:  Two 19-French drains on each side.  CLINICAL NOTE:  A 50 year old woman who has breast cancer and is having bilateral mastectomy and desires reconstruction.  She has breast implants in place.  Options discussed.  She realized this was a clearly complicated procedure with some need for the use of some sort of acellular dermal matrix in order to facilitate and perform her breast reconstruction.  That was discussed in detail and she preferred to use a silicone gel implant, but she understood that it might be necessary just to use a tissue expander for now.  If she was fine with a smaller breast with the breast reconstruction if we were unable to place an implant that was the size of her own breast and the implant that she had in place.  If we need to be a little bit smaller that she said that she was perfectly fine with that and just proceed.  These procedures were discussed with her in great detail.  Risks include, but not limited to bleeding, infection, anesthesia complications, healing problems, scarring,  loss of sensation, fluid accumulation, pneumothorax, pulmonary embolism, failure of device, capsular contracture, wrinkles, ripples, and displacement of the devices, asymmetry, disappointment, and she understood she has an increased risk for healing problems because she had recently been on steroids for her Crohn disease.  She had just now stopped her steroids just a few days prior to the surgery.  She also understood that healing was unpredictable and with these type of procedures she could have skin loss as well as other tissue and could lose her reconstruction and she understood all of this and wished to proceed.  She also understood the possibility of revision surgeries.  DESCRIPTION OF PROCEDURE:  The patient was taken to the operating room and General Surgery had completed the bilateral mastectomy.  Irrigation with saline.  The lateral aspect of the capsules required the reinforcement with the HD Flex.  This was soaked in antibiotic solution for greater than 5 minutes and then it was positioned and secured with 3- 0 PDS running suture.  Antibiotic solution again placed.  The muscles were then opened.  The implants removed and the sizer was placed on one side and filled to 500 mL.  The skin  was stapled closed and was found to close very nicely and fit.  The skin had excellent color and had bright red blood along its periphery consistent with viability.  There was not a 500 mL implant available, so it was felt that it was wisest to drop back to the 450 mL silicone gel in order to avoid stressing the skin. The implants were soaked in antibiotic solution, 19-French drains were positioned, brought out through separate stab wounds inferolaterally and secured with 3-0 Prolene sutures.  The implants were positioned.  The muscle was closed with 3-0 Vicryl interrupted figure-of-eight sutures taking great care to avoid damage to the underlying implants.  The acellular dermal matrix had been  sutured in position, was sutured in such a manner that the dermis was facing up towards the mastectomy flap and the epidermis facing towards the underlying implant.  Irrigation with saline and antibiotic solution and meticulous hemostasis with electrocautery and the skin closed with 3-0 Monocryl interrupted deep dermal sutures and Dermabond glue was then applied, dry sterile dressings and Ace wrap as well as the chest binder, and she was transferred to the recovery room stable and tolerated the procedure well.  DISPOSITION:  She will be observed overnight in the hospital.     Etter Sjogren, M.D.     DB/MEDQ  D:  07/27/2010  T:  07/28/2010  Job:  [id number]  Electronically Signed by Etter Sjogren M.D. on 08/23/2010 08:40:11 AM

## 2010-08-23 NOTE — Discharge Summary (Signed)
  NAMEJASIEL, [name] NO.:  [id number]  MEDICAL RECORD NO.:  [id number]  LOCATION:  5124                         FACILITY:  [facility]  PHYSICIAN:  Etter Sjogren, M.D.     DATE OF BIRTH:  Jun 23, 1960  DATE OF ADMISSION:  07/27/2010 DATE OF DISCHARGE:  07/29/2010                              DISCHARGE SUMMARY   FINAL DIAGNOSIS:  Breast cancer.  PROCEDURES PERFORMED:  Left side total mastectomy and right side modified radical mastectomy and bilateral immediate breast reconstruction with implants and bilateral reconstruction using acellular dermal matrix.  SUMMARY OF HISTORY AND PHYSICAL:  A 50 year old woman has previous implants in place and has developed breast cancer and is having bilateral mastectomy.  She desired reconstruction.  Various options discussed in great detail and she decided to go with immediate reconstruction with an implant if possible, replacement of a tissue expander if not.  She understood that she would be smaller if we were to place the implant.  She was fine with that and she also understood the use of acellular dermal matrix.  Other risks and post complications were discussed with her in great detail including but not limited to bleeding, infection, anesthesia complications, healing problems, scarring, fluid accumulation, loss of sensation, failure of device, capsular contracture, wrinkles, ripples, and displacement of devices, asymmetry, disappointment, pneumothorax, pulmonary embolism and she was at increased risk for healing problems as well as infection due to her recent use of steroids for Crohn disease.  Having understood all this, she wished to proceed.  For further details of history and physical, please see the chart.  COURSE IN THE HOSPITAL:  On admission, she was taken to the surgery at which time the mastectomies performed, a right modified radical on the right and total on the left and reconstruction with silicone  gel implants and acellular dermal matrix.  She tolerated those procedures well.  Postoperatively, she did well.  She had good pain control, had a headache that resolved and drains were functioning thin drainage and no evidence any hematoma or infection and then it was felt she is ready to be discharged.  She does want to go home on the second postoperative day and she had been instructed to empty the drains 3 times a day and record the amounts.  No dietary restrictions.  No shower yet.  No driving yet. She will be on Keflex 500 mg one p.o. q.i.d. for 3 days and then one p.o. b.i.d., Dilaudid 2 mg 1-2 p.o. q.4 h p.r.n. for pain, and Robaxin 500 mg 1 p.o. q.8.  We will see her back in the office early this coming week for recheck.     Etter Sjogren, M.D.     DB/MEDQ  D:  07/29/2010  T:  07/30/2010  Job:  [id number]  Electronically Signed by Etter Sjogren M.D. on 08/23/2010 08:38:56 AM

## 2010-08-24 ENCOUNTER — Ambulatory Visit (HOSPITAL_COMMUNITY)
Admission: RE | Admit: 2010-08-24 | Discharge: 2010-08-24 | Disposition: A | Discharge status: Home / Self Care | Payer: Managed Care, Other (non HMO) | Source: Ambulatory Visit | Attending: Oncology | Admitting: Oncology

## 2010-08-26 ENCOUNTER — Other Ambulatory Visit: Payer: Self-pay | Admitting: Oncology

## 2010-08-26 ENCOUNTER — Encounter (HOSPITAL_BASED_OUTPATIENT_CLINIC_OR_DEPARTMENT_OTHER): Payer: Managed Care, Other (non HMO) | Admitting: Oncology

## 2010-08-26 DIAGNOSIS — C50919 Malignant neoplasm of unspecified site of unspecified female breast: Secondary | ICD-10-CM

## 2010-08-26 DIAGNOSIS — C50419 Malignant neoplasm of upper-outer quadrant of unspecified female breast: Secondary | ICD-10-CM

## 2010-08-26 LAB — CBC WITH DIFFERENTIAL/PLATELET
BASO%: 0.4 % (ref 0.0–2.0)
EOS%: 2 % (ref 0.0–7.0)
LYMPH%: 29.7 % (ref 14.0–49.7)
MCH: 27 pg (ref 25.1–34.0)
MCHC: 30.7 g/dL — ABNORMAL LOW (ref 31.5–36.0)
MCV: 87.9 fL (ref 79.5–101.0)
MONO%: 8.1 % (ref 0.0–14.0)
Platelets: 277 10*3/uL (ref 145–400)
RBC: 3.71 10*6/uL (ref 3.70–5.45)
RDW: 14.9 % — ABNORMAL HIGH (ref 11.2–14.5)
nRBC: 0 % (ref 0–0)

## 2010-08-26 LAB — COMPREHENSIVE METABOLIC PANEL
AST: 28 U/L (ref 0–37)
Alkaline Phosphatase: 62 U/L (ref 39–117)
BUN: 16 mg/dL (ref 6–23)
Calcium: 9 mg/dL (ref 8.4–10.5)
Chloride: 110 mEq/L (ref 96–112)
Creatinine, Ser: 1.02 mg/dL (ref 0.50–1.10)
Total Bilirubin: 0.2 mg/dL — ABNORMAL LOW (ref 0.3–1.2)

## 2010-08-27 ENCOUNTER — Ambulatory Visit (HOSPITAL_COMMUNITY)
Admission: RE | Admit: 2010-08-27 | Discharge: 2010-08-27 | Disposition: A | Discharge status: Home / Self Care | Payer: Managed Care, Other (non HMO) | Source: Ambulatory Visit | Attending: Plastic Surgery | Admitting: Plastic Surgery

## 2010-08-27 DIAGNOSIS — Y836 Removal of other organ (partial) (total) as the cause of abnormal reaction of the patient, or of later complication, without mention of misadventure at the time of the procedure: Secondary | ICD-10-CM | POA: Insufficient documentation

## 2010-08-27 DIAGNOSIS — K5090 Crohn's disease, unspecified, without complications: Secondary | ICD-10-CM | POA: Insufficient documentation

## 2010-08-27 DIAGNOSIS — Z853 Personal history of malignant neoplasm of breast: Secondary | ICD-10-CM | POA: Insufficient documentation

## 2010-08-27 DIAGNOSIS — Z9010 Acquired absence of unspecified breast and nipple: Secondary | ICD-10-CM | POA: Insufficient documentation

## 2010-08-27 DIAGNOSIS — T8189XA Other complications of procedures, not elsewhere classified, initial encounter: Secondary | ICD-10-CM | POA: Insufficient documentation

## 2010-08-30 ENCOUNTER — Encounter (HOSPITAL_COMMUNITY)
Admission: RE | Admit: 2010-08-30 | Discharge: 2010-08-30 | Disposition: A | Discharge status: Home / Self Care | Payer: Managed Care, Other (non HMO) | Source: Ambulatory Visit | Attending: Oncology | Admitting: Oncology

## 2010-08-30 ENCOUNTER — Other Ambulatory Visit: Payer: Self-pay | Admitting: Oncology

## 2010-08-30 ENCOUNTER — Encounter (HOSPITAL_BASED_OUTPATIENT_CLINIC_OR_DEPARTMENT_OTHER): Payer: Managed Care, Other (non HMO) | Admitting: Oncology

## 2010-08-30 ENCOUNTER — Encounter (HOSPITAL_COMMUNITY): Payer: Self-pay

## 2010-08-30 DIAGNOSIS — C50919 Malignant neoplasm of unspecified site of unspecified female breast: Secondary | ICD-10-CM

## 2010-08-30 DIAGNOSIS — C50419 Malignant neoplasm of upper-outer quadrant of unspecified female breast: Secondary | ICD-10-CM

## 2010-08-30 MED ORDER — TECHNETIUM TC 99M-LABELED RED BLOOD CELLS IV KIT
23.6000 | PACK | Freq: Once | INTRAVENOUS | Status: AC | PRN
  Administered 2010-08-30: 23.6 via INTRAVENOUS

## 2010-09-02 ENCOUNTER — Other Ambulatory Visit (HOSPITAL_COMMUNITY): Payer: Managed Care, Other (non HMO)

## 2010-09-03 ENCOUNTER — Ambulatory Visit
Admission: RE | Admit: 2010-09-03 | Discharge: 2010-09-03 | Disposition: A | Discharge status: Home / Self Care | Payer: Managed Care, Other (non HMO) | Source: Ambulatory Visit | Attending: Radiation Oncology | Admitting: Radiation Oncology

## 2010-09-03 DIAGNOSIS — C50919 Malignant neoplasm of unspecified site of unspecified female breast: Secondary | ICD-10-CM | POA: Insufficient documentation

## 2010-09-13 ENCOUNTER — Encounter (INDEPENDENT_AMBULATORY_CARE_PROVIDER_SITE_OTHER): Payer: Self-pay | Admitting: General Surgery

## 2010-09-13 ENCOUNTER — Ambulatory Visit (INDEPENDENT_AMBULATORY_CARE_PROVIDER_SITE_OTHER): Payer: Managed Care, Other (non HMO) | Admitting: General Surgery

## 2010-09-13 DIAGNOSIS — C50919 Malignant neoplasm of unspecified site of unspecified female breast: Secondary | ICD-10-CM | POA: Insufficient documentation

## 2010-09-13 NOTE — Progress Notes (Signed)
Subjective:     Patient ID: [name], female   DOB: [birth year], 50 y.o.   MRN: [id number]  HPI The patient is a 50 year old white female who is now about a month and a half out from bilateral mastectomies and right axillary lymph node dissection with reconstruction by Dr. Odis Luster. She received neoadjuvant therapy. Her original tumor was 1.1 cm with positive right axillary lymph nodes. 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 Her postop course was uncomplicated by some skin necrosis on the left chest wall. She's doing aqua cell dressing changes to this area.  Review of Systems     Objective:   Physical Exam On exam her right mastectomy incision is healed nicely. Her left mastectomy incision is healing there is a small area of good granulation tissue on the superior flap. No palpable mass of either chest wall.    Assessment:     1-1/2 months postop from bilateral mastectomies and right axillary lymph node dissection.    Plan:     She will continue the local wound care to the left chest wall as dictated by Dr. Odis Luster. We will plan to see her back in a couple months to check her progress. She also has a history of Crohn's disease with a fistula. She has a diverting ostomy. She has seen a Careers adviser in [place] who has been managing this. I have encouraged her to make an appointment and return to him for followup.

## 2010-09-13 NOTE — Patient Instructions (Signed)
Continue local wound care with Dr. Odis Luster Make appt with surgeon at [facility]

## 2010-09-17 ENCOUNTER — Encounter (HOSPITAL_COMMUNITY): Payer: Self-pay | Admitting: *Deleted

## 2010-09-20 ENCOUNTER — Ambulatory Visit (HOSPITAL_COMMUNITY)
Admission: RE | Admit: 2010-09-20 | Discharge: 2010-09-20 | Disposition: A | Discharge status: Home / Self Care | Payer: Managed Care, Other (non HMO) | Source: Ambulatory Visit | Attending: Internal Medicine | Admitting: Internal Medicine

## 2010-09-20 ENCOUNTER — Ambulatory Visit (HOSPITAL_COMMUNITY): Payer: Managed Care, Other (non HMO)

## 2010-09-20 VITALS — BP 120/71 | HR 79 | Ht 66.0 in | Wt 148.0 lb

## 2010-09-20 DIAGNOSIS — C50919 Malignant neoplasm of unspecified site of unspecified female breast: Secondary | ICD-10-CM

## 2010-09-20 NOTE — Progress Notes (Signed)
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  Diagnosed with R breast CA in November 2011. Treated with neoadjuvant chemo (TCH regimen). Underwent bilateral mastectomies June 12,2012 c/b delayed wound healing left breat. Pathology high-grade ductal CA of R breast 0/6 lymph nodes negative at surgery (but pre-op lymph node bx +) Triple receptor positive.  Pre-chemo echo read as EF 55% in 12/11.  Then treated with one or 2 doses Herceptin but this was stopped after echo 08/24/10: EF 50%. However, had f/u MUGA with EF 59%.  Doing well from cardiac perspeciive. Denies CP or dyspnea. Occasional swelling. BP usually runs 120/70 but goes a lot lower when Crohn's flares.   ROS: All other systems normal except as mentioned in HPI, past medical history and problem list.    Past Medical History  Diagnosis Date  . Thyroid disease   . Breast cancer   . GERD (gastroesophageal reflux disease)   . Wears glasses   . Crohn's   . History of small bowel obstruction   . Depression     Current Outpatient Prescriptions  Medication Sig Dispense Refill  . amphetamine-dextroamphetamine (ADDERALL XR) 20 MG 24 hr capsule Ad lib.      Marland Kitchen HYDROmorphone (DILAUDID) 2 MG tablet Take 2 mg by mouth once as needed.        Marland Kitchen levothyroxine (SYNTHROID, LEVOTHROID) 200 MCG tablet Take 200 mcg by mouth 1 dose over 24 hours.        . predniSONE (DELTASONE) 20 MG tablet Take 20 mg by mouth daily.        . promethazine (PHENERGAN) 25 MG tablet Take 25 mg by mouth every 6 (six) hours as needed.        . traMADol (ULTRAM) 50 MG tablet Ad lib.         Allergies  Allergen Reactions  . Augmentin   . Doxycycline     History   Social History  . Marital Status: Married    Spouse Name: N/A    Number of Children: N/A  . Years of Education: N/A   Occupational History  . Not on file.   Social History Main Topics  . Smoking status: Former Smoker    Quit date:  02/10/2007  . Smokeless tobacco: Not on file  . Alcohol Use: No  . Drug Use: No  . Sexually Active: Not on file   Other Topics Concern  . Not on file   Social History Narrative  . No narrative on file    Family History  Problem Relation Age of Onset  . Alzheimer's disease Mother   . Diabetes Father   . Hypertension Father   . Crohn's disease Sister     PHYSICAL EXAM: Filed Vitals:   09/20/10 1555  BP: 120/71  Pulse: 79   General:  Well appearing. No respiratory difficulty HEENT: normal Neck: supple. no JVD. Carotids 2+ bilat; no bruits. No lymphadenopathy or thryomegaly appreciated. Cor: PMI nondisplaced. Regular rate & rhythm. No rubs, gallops or murmurs. Lungs: clear Abdomen: soft, nontender, nondistended. No hepatosplenomegaly. No bruits or masses. Good bowel sounds. +colostomy Extremities: no cyanosis, clubbing, rash, edema Neuro: alert & oriented x 3, cranial nerves grossly intact. moves all 4 extremities w/o difficulty. Affect pleasant.  ASSESSMENT & PLAN:

## 2010-09-20 NOTE — Patient Instructions (Signed)
Your physician has requested that you have an echocardiogram. Echocardiography is a painless test that uses sound waves to create images of your heart. It provides your doctor with information about the size and shape of your heart and how well your heart's chambers and valves are working. This procedure takes approximately one hour. There are no restrictions for this procedure.  Needs in 3 months.  Your physician recommends that you schedule a follow-up appointment in: 3 months.

## 2010-09-21 NOTE — Assessment & Plan Note (Signed)
I reviewed both of Ms. Holsapple's echos and I feel that EF on 2nd echo was under-read and EF looks stable at 55-60%. This is confirmed by MUGA and also by a preserved lateral s' velocity on tissue doppler.  Thus I think she can continue Herceptin therapy at this point without any interruption. We had a long discussion about the incidence of Herceptin cardiotoxicity and the benefits of therapy with this agent. We also discussed possible initiation of prophylactic ACE-I and b-blocker however we elected to defer given her episodes of hypotension with her Crohns flares. We will continue to follow closely with echos q 3months.   Patient seen and examined with Ulyess Blossom PA-C. We discussed all aspects of the encounter. I agree with the assessment and plan as stated above.

## 2010-09-27 ENCOUNTER — Encounter (HOSPITAL_BASED_OUTPATIENT_CLINIC_OR_DEPARTMENT_OTHER): Payer: Managed Care, Other (non HMO) | Admitting: Oncology

## 2010-09-27 ENCOUNTER — Other Ambulatory Visit: Payer: Self-pay | Admitting: Oncology

## 2010-09-27 DIAGNOSIS — C50419 Malignant neoplasm of upper-outer quadrant of unspecified female breast: Secondary | ICD-10-CM

## 2010-09-27 DIAGNOSIS — Z5112 Encounter for antineoplastic immunotherapy: Secondary | ICD-10-CM

## 2010-09-27 LAB — CBC WITH DIFFERENTIAL/PLATELET
Basophils Absolute: 0 10*3/uL (ref 0.0–0.1)
Eosinophils Absolute: 0.1 10*3/uL (ref 0.0–0.5)
HCT: 38 % (ref 34.8–46.6)
HGB: 11.7 g/dL (ref 11.6–15.9)
LYMPH%: 14.5 % (ref 14.0–49.7)
MCV: 87.8 fL (ref 79.5–101.0)
MONO#: 0.7 10*3/uL (ref 0.1–0.9)
MONO%: 6.1 % (ref 0.0–14.0)
NEUT#: 9.2 10*3/uL — ABNORMAL HIGH (ref 1.5–6.5)
Platelets: 273 10*3/uL (ref 145–400)
RBC: 4.33 10*6/uL (ref 3.70–5.45)
WBC: 11.6 10*3/uL — ABNORMAL HIGH (ref 3.9–10.3)

## 2010-09-27 LAB — COMPREHENSIVE METABOLIC PANEL
Albumin: 3.8 g/dL (ref 3.5–5.2)
BUN: 22 mg/dL (ref 6–23)
CO2: 20 mEq/L (ref 19–32)
Glucose, Bld: 108 mg/dL — ABNORMAL HIGH (ref 70–99)
Sodium: 138 mEq/L (ref 135–145)
Total Bilirubin: 0.3 mg/dL (ref 0.3–1.2)
Total Protein: 6.3 g/dL (ref 6.0–8.3)

## 2010-09-29 NOTE — Op Note (Signed)
  NAMESISSY, GOETZKE NO.:  [id number]  MEDICAL RECORD NO.:  [id number]  LOCATION:  SDSC                         FACILITY:  [facility]  PHYSICIAN:  Etter Sjogren, M.D.     DATE OF BIRTH:  1960/08/31  DATE OF PROCEDURE:  08/27/2010 DATE OF DISCHARGE:  08/27/2010                              OPERATIVE REPORT   PREOPERATIVE DIAGNOSIS:  Open wound left breast, status post mastectomy and breast reconstruction with implant for breast cancer.  POSTOPERATIVE DIAGNOSIS:  Open wound left breast, status post mastectomy and breast reconstruction with implant for breast cancer.  PROCEDURES PERFORMED: 1. Preparation of recipient site left breast. 2. ACell xenograft application left breast.  SURGEON:  Etter Sjogren, MD  ANESTHESIA:  Xylocaine 0.5% plain.  CLINICAL NOTE:  This is a 50 year old woman who has had bilateral mastectomy, bilateral reconstruction with implants for breast cancer. She had done well, but that she did develop some loss of skin from her mastectomy flap centrally on the left breast superior flap.  Options were discussed.  This wound was cleaned.  There was no evidence of any infection.  The implant is in good condition, well covered by muscle, but there is a 5 x 5-cm wound that warrants intervention.  The best option would be to place a xenograft and hopefully have repeat elevation of this wound.  She understands the initial procedure and the risks and possibly further surgeries in the future and wishes to proceed.  DESCRIPTION OF PROCEDURE:  The patient was taken to the operating room and placed supine.  She was prepped with Betadine around the periphery and just saline on the wound itself.  The wound was then debrided around the periphery of the wound after placing little bit of 0.5% Xylocaine plain.  This was a sharp debridement around the edge of the wound, and then the base of wound was also debrided sharply and there was excellent bleeding  consistent with good wound bed.  The irrigation with saline and the ACell was then positioned with the rough side down towards the wound bed and secured around the periphery using a 4-0 nylon simple interrupted sutures.  Adaptic was then placed over this and was secured in the corners using 4-0 nylon simple interrupted sutures and hydrogel placed and then some pads,  4 x 4's, and Hypafix tape.  She tolerated the procedure well.  DISPOSITION:  She will check back in the office with [name] in 3 days.     Etter Sjogren, M.D.     DB/MEDQ  D:  08/27/2010  T:  08/28/2010  Job:  [id number]  Electronically Signed by Etter Sjogren M.D. on 09/29/2010 02:57:27 PM

## 2010-10-21 ENCOUNTER — Other Ambulatory Visit: Payer: Self-pay | Admitting: Oncology

## 2010-10-21 ENCOUNTER — Encounter (HOSPITAL_BASED_OUTPATIENT_CLINIC_OR_DEPARTMENT_OTHER): Payer: Managed Care, Other (non HMO) | Admitting: Oncology

## 2010-10-21 DIAGNOSIS — K5090 Crohn's disease, unspecified, without complications: Secondary | ICD-10-CM

## 2010-10-21 DIAGNOSIS — Z5112 Encounter for antineoplastic immunotherapy: Secondary | ICD-10-CM

## 2010-10-21 DIAGNOSIS — C50419 Malignant neoplasm of upper-outer quadrant of unspecified female breast: Secondary | ICD-10-CM

## 2010-10-21 LAB — CBC WITH DIFFERENTIAL/PLATELET
Basophils Absolute: 0 10*3/uL (ref 0.0–0.1)
Eosinophils Absolute: 0 10*3/uL (ref 0.0–0.5)
HGB: 11 g/dL — ABNORMAL LOW (ref 11.6–15.9)
MONO#: 0.3 10*3/uL (ref 0.1–0.9)
NEUT#: 7 10*3/uL — ABNORMAL HIGH (ref 1.5–6.5)
Platelets: 274 10*3/uL (ref 145–400)
RBC: 4.09 10*6/uL (ref 3.70–5.45)
RDW: 17.7 % — ABNORMAL HIGH (ref 11.2–14.5)
WBC: 9.1 10*3/uL (ref 3.9–10.3)
nRBC: 0 % (ref 0–0)

## 2010-10-21 LAB — COMPREHENSIVE METABOLIC PANEL
ALT: 12 U/L (ref 0–35)
Albumin: 3.1 g/dL — ABNORMAL LOW (ref 3.5–5.2)
CO2: 22 mEq/L (ref 19–32)
Calcium: 7.8 mg/dL — ABNORMAL LOW (ref 8.4–10.5)
Chloride: 111 mEq/L (ref 96–112)
Sodium: 143 mEq/L (ref 135–145)
Total Protein: 5.4 g/dL — ABNORMAL LOW (ref 6.0–8.3)

## 2010-10-27 ENCOUNTER — Encounter (INDEPENDENT_AMBULATORY_CARE_PROVIDER_SITE_OTHER): Payer: Self-pay | Admitting: General Surgery

## 2010-11-11 ENCOUNTER — Other Ambulatory Visit: Payer: Self-pay | Admitting: Oncology

## 2010-11-11 ENCOUNTER — Encounter (HOSPITAL_BASED_OUTPATIENT_CLINIC_OR_DEPARTMENT_OTHER): Payer: Managed Care, Other (non HMO) | Admitting: Oncology

## 2010-11-11 DIAGNOSIS — Z5112 Encounter for antineoplastic immunotherapy: Secondary | ICD-10-CM

## 2010-11-11 DIAGNOSIS — Z170 Estrogen receptor positive status [ER+]: Secondary | ICD-10-CM

## 2010-11-11 DIAGNOSIS — C50419 Malignant neoplasm of upper-outer quadrant of unspecified female breast: Secondary | ICD-10-CM

## 2010-11-11 DIAGNOSIS — Z5111 Encounter for antineoplastic chemotherapy: Secondary | ICD-10-CM

## 2010-11-11 LAB — COMPREHENSIVE METABOLIC PANEL
ALT: 10 U/L (ref 0–35)
AST: 16 U/L (ref 0–37)
Calcium: 8.3 mg/dL — ABNORMAL LOW (ref 8.4–10.5)
Chloride: 98 mEq/L (ref 96–112)
Creatinine, Ser: 0.95 mg/dL (ref 0.50–1.10)
Potassium: 4.3 mEq/L (ref 3.5–5.3)
Sodium: 136 mEq/L (ref 135–145)

## 2010-11-11 LAB — CBC WITH DIFFERENTIAL/PLATELET
BASO%: 0.4 % (ref 0.0–2.0)
LYMPH%: 20.8 % (ref 14.0–49.7)
MCHC: 31.7 g/dL (ref 31.5–36.0)
MONO#: 0.8 10*3/uL (ref 0.1–0.9)
RBC: 3.92 10*6/uL (ref 3.70–5.45)
RDW: 16.2 % — ABNORMAL HIGH (ref 11.2–14.5)
WBC: 8.6 10*3/uL (ref 3.9–10.3)
lymph#: 1.8 10*3/uL (ref 0.9–3.3)
nRBC: 0 % (ref 0–0)

## 2010-11-15 DEATH — deceased

## 2010-12-22 ENCOUNTER — Other Ambulatory Visit: Payer: Self-pay | Admitting: Oncology

## 2010-12-22 DIAGNOSIS — C50919 Malignant neoplasm of unspecified site of unspecified female breast: Secondary | ICD-10-CM

## 2010-12-23 ENCOUNTER — Ambulatory Visit (HOSPITAL_BASED_OUTPATIENT_CLINIC_OR_DEPARTMENT_OTHER): Payer: Managed Care, Other (non HMO) | Admitting: Physician Assistant

## 2010-12-23 ENCOUNTER — Other Ambulatory Visit: Payer: Self-pay | Admitting: Oncology

## 2010-12-23 ENCOUNTER — Ambulatory Visit (HOSPITAL_BASED_OUTPATIENT_CLINIC_OR_DEPARTMENT_OTHER): Payer: Managed Care, Other (non HMO)

## 2010-12-23 ENCOUNTER — Other Ambulatory Visit (HOSPITAL_BASED_OUTPATIENT_CLINIC_OR_DEPARTMENT_OTHER): Payer: Managed Care, Other (non HMO) | Admitting: Lab

## 2010-12-23 VITALS — BP 131/77 | HR 85 | Temp 98.5°F | Ht 66.0 in | Wt 145.1 lb

## 2010-12-23 DIAGNOSIS — C50419 Malignant neoplasm of upper-outer quadrant of unspecified female breast: Secondary | ICD-10-CM

## 2010-12-23 DIAGNOSIS — C50919 Malignant neoplasm of unspecified site of unspecified female breast: Secondary | ICD-10-CM

## 2010-12-23 DIAGNOSIS — Z5112 Encounter for antineoplastic immunotherapy: Secondary | ICD-10-CM

## 2010-12-23 DIAGNOSIS — Z170 Estrogen receptor positive status [ER+]: Secondary | ICD-10-CM

## 2010-12-23 LAB — COMPREHENSIVE METABOLIC PANEL
ALT: 14 U/L (ref 0–35)
Albumin: 3.3 g/dL — ABNORMAL LOW (ref 3.5–5.2)
CO2: 18 mEq/L — ABNORMAL LOW (ref 19–32)
Calcium: 8.1 mg/dL — ABNORMAL LOW (ref 8.4–10.5)
Chloride: 112 mEq/L (ref 96–112)
Glucose, Bld: 93 mg/dL (ref 70–99)
Potassium: 4 mEq/L (ref 3.5–5.3)
Sodium: 142 mEq/L (ref 135–145)
Total Protein: 5.7 g/dL — ABNORMAL LOW (ref 6.0–8.3)

## 2010-12-23 LAB — CBC WITH DIFFERENTIAL/PLATELET
Basophils Absolute: 0 10*3/uL (ref 0.0–0.1)
Eosinophils Absolute: 0.2 10*3/uL (ref 0.0–0.5)
HGB: 9.8 g/dL — ABNORMAL LOW (ref 11.6–15.9)
MCV: 86.5 fL (ref 79.5–101.0)
MONO#: 0.5 10*3/uL (ref 0.1–0.9)
MONO%: 8 % (ref 0.0–14.0)
RBC: 3.56 10*6/uL — ABNORMAL LOW (ref 3.70–5.45)
RDW: 16 % — ABNORMAL HIGH (ref 11.2–14.5)
nRBC: 0 % (ref 0–0)

## 2010-12-23 MED ORDER — SODIUM CHLORIDE 0.9 % IJ SOLN
10.0000 mL | INTRAMUSCULAR | Status: DC | PRN
  Administered 2010-12-23: 10 mL
  Filled 2010-12-23: qty 10

## 2010-12-23 MED ORDER — ACETAMINOPHEN 325 MG PO TABS
650.0000 mg | ORAL_TABLET | Freq: Once | ORAL | Status: AC
  Administered 2010-12-23: 650 mg via ORAL

## 2010-12-23 MED ORDER — DIPHENHYDRAMINE HCL 25 MG PO CAPS
50.0000 mg | ORAL_CAPSULE | Freq: Once | ORAL | Status: AC
  Administered 2010-12-23: 50 mg via ORAL

## 2010-12-23 MED ORDER — HEPARIN SOD (PORK) LOCK FLUSH 100 UNIT/ML IV SOLN
500.0000 [IU] | Freq: Once | INTRAVENOUS | Status: AC | PRN
  Administered 2010-12-23: 500 [IU]
  Filled 2010-12-23: qty 5

## 2010-12-23 MED ORDER — TRASTUZUMAB CHEMO INJECTION 440 MG
6.0000 mg/kg | Freq: Once | INTRAVENOUS | Status: AC
  Administered 2010-12-23: 420 mg via INTRAVENOUS
  Filled 2010-12-23: qty 20

## 2010-12-23 MED ORDER — SODIUM CHLORIDE 0.9 % IV SOLN
Freq: Once | INTRAVENOUS | Status: AC
  Administered 2010-12-23: 13:00:00 via INTRAVENOUS

## 2010-12-23 NOTE — Progress Notes (Signed)
Progress note dictated-CTS 

## 2010-12-23 NOTE — Progress Notes (Deleted)
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  PAST THERAPY:  Status post neoadjuvant chemotherapy comprised of Taxotere, carboplatin, and Herceptin from 01/2010 to 04/2010.  The patient then underwent bilateral mastectomies July 27, 2010, with final pathology of the left breast showing benign breast tissue with fibrocystic changes and columnar hyperplasia, no atypia or malignancy, 1 sentinel node clear for any kind of malignancy.  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  She declined radiation therapy under the care of Dr. Basilio Cairo.  CURRENT THERAPY:  Q.3-week Herceptin, due for next dosing today.  Also to resume tamoxifen 20 mg p.o. daily concomitantly.  SUBJECTIVE:  [name] is seen today for followup.  She is actually doing quite well overall.  She is quite upbeat.  She initiated Effexor XR 75 mg last week by her primary care physician.  This has been updated in her EMR.  She denies any fevers, chills, night sweats, shortness of breath, chest pain.  No nausea, emesis, diarrhea, or constipation issues.  She is quite pleased that she has had her colostomy reversal.  REVIEW OF SYSTEMS:  Otherwise negative.  ALLERGIES:  Augmentin and doxycycline.  CURRENT MEDICATIONS:  Reviewed with the patient, as per EMR.  ECOG status of 0.  OBJECTIVE PHYSICAL EXAMINATION:  Vital Signs:  Blood pressure is 131/77, pulse 85, respirations 20, temp 98.5, weight 145 pounds.  HEENT: Conjunctivae pink.  Sclerae anicteric.  Oropharynx is benign without oral mucositis or candidiasis.  Lungs:  Clear to auscultation without wheezing or rhonchi.  Heart:  Regular rate and rhythm without murmurs, rubs, gallops, or  clicks.  Breasts:  Bilateral breast reconstruction sites are well healed without any skin nodules or masses.  Abdomen: Soft, normal bowel sounds are present.  Extremities:  Free of pedal edema.  Neurologic Exam:  Nonfocal.  LABORATORY DATA:  Hemoglobin 9.8 g, platelet count 260,000, WBC 6200, with an ANC of 3400.  Chemistry panel pending.  IMPRESSION:  A 50 year old [place], [place], woman with a history of a stage II triple-negative right breast carcinoma, for which she received neoadjuvant chemotherapy, followed by bilateral mastectomies with a right axillary lymph node dissection.  She is now on maintenance Herceptin and due to resume her tamoxifen 20 mg p.o. daily after bilateral oophorectomies and bowel resection at [facility] on 11/04/2010.  Case has been reviewed with Dr. Welton Flakes.  PLAN:  Proceed with treatment today as scheduled.  Ceylin will return in 3 weeks' time for a followup exam with Dr. Welton Flakes.  She knows to contact us in the interim if the need should arise.    ______________________________ Sharyl Nimrod, PA CS/MEDQ  D:  12/23/2010  T:  12/23/2010  Job:  [id number]

## 2010-12-24 NOTE — Progress Notes (Deleted)
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  PAST THERAPY:  Status post neoadjuvant chemotherapy comprised of Taxotere, carboplatin, and Herceptin from 01/2010 to 04/2010.  The patient then underwent bilateral mastectomies July 27, 2010, with final pathology of the left breast showing benign breast tissue with fibrocystic changes and columnar hyperplasia, no atypia or malignancy, 1 sentinel node clear for any kind of malignancy.  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  She declined radiation therapy under the care of Dr. Squire.  CURRENT THERAPY:  Q.3-week Herceptin, due for next dosing today.  Also to resume tamoxifen 20 mg p.o. daily concomitantly.  SUBJECTIVE:  [name] is seen today for followup.  She is actually doing quite well overall.  She is quite upbeat.  She initiated Effexor XR 75 mg last week by her primary care physician.  This has been updated in her EMR.  She denies any fevers, chills, night sweats, shortness of breath, chest pain.  No nausea, emesis, diarrhea, or constipation issues.  She is quite pleased that she has had her colostomy reversal.  REVIEW OF SYSTEMS:  Otherwise negative.  ALLERGIES:  Augmentin and doxycycline.  CURRENT MEDICATIONS:  Reviewed with the patient, as per EMR.  ECOG status of 0.  OBJECTIVE PHYSICAL EXAMINATION:  Vital Signs:  Blood pressure is 131/77, pulse 85, respirations 20, temp 98.5, weight 145 pounds.  HEENT: Conjunctivae pink.  Sclerae anicteric.  Oropharynx is benign without oral mucositis or candidiasis.  Lungs:  Clear to auscultation without wheezing or rhonchi.  Heart:  Regular rate and rhythm without murmurs, rubs, gallops, or  clicks.  Breasts:  Bilateral breast reconstruction sites are well healed without any skin nodules or masses.  Abdomen: Soft, normal bowel sounds are present.  Extremities:  Free of pedal edema.  Neurologic Exam:  Nonfocal.  LABORATORY DATA:  Hemoglobin 9.8 g, platelet count 260,000, WBC 6200, with an ANC of 3400.  Chemistry panel pending.  IMPRESSION:  A 49-year-old [name], [place], woman with a history of a stage II triple-negative right breast carcinoma, for which she received neoadjuvant chemotherapy, followed by bilateral mastectomies with a right axillary lymph node dissection.  She is now on maintenance Herceptin and due to resume her tamoxifen 20 mg p.o. daily after bilateral oophorectomies and bowel resection at [facility] on 11/04/2010.  Case has been reviewed with Dr. Khan.  PLAN:  Proceed with treatment today as scheduled.  Medrith will return in 3 weeks' time for a followup exam with Dr. Khan.  She knows to contact us in the interim if the need should arise.    ______________________________ Leah Roppolo, PA CS/MEDQ  D:  12/23/2010  T:  12/23/2010  Job:  [id number] 

## 2010-12-27 ENCOUNTER — Ambulatory Visit (HOSPITAL_COMMUNITY): Payer: Managed Care, Other (non HMO)

## 2010-12-27 NOTE — Progress Notes (Signed)
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  PAST THERAPY:  Status post neoadjuvant chemotherapy comprised of Taxotere, carboplatin, and Herceptin from 01/2010 to 04/2010.  The patient then underwent bilateral mastectomies July 27, 2010, with final pathology of the left breast showing benign breast tissue with fibrocystic changes and columnar hyperplasia, no atypia or malignancy, 1 sentinel node clear for any kind of malignancy.  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  She declined radiation therapy under the care of Dr. Squire.  CURRENT THERAPY:  Q.3-week Herceptin, due for next dosing today.  Also to resume tamoxifen 20 mg p.o. daily concomitantly.  SUBJECTIVE:  [name] is seen today for followup.  She is actually doing quite well overall.  She is quite upbeat.  She initiated Effexor XR 75 mg last week by her primary care physician.  This has been updated in her EMR.  She denies any fevers, chills, night sweats, shortness of breath, chest pain.  No nausea, emesis, diarrhea, or constipation issues.  She is quite pleased that she has had her colostomy reversal.  REVIEW OF SYSTEMS:  Otherwise negative.  ALLERGIES:  Augmentin and doxycycline.  CURRENT MEDICATIONS:  Reviewed with the patient, as per EMR.  ECOG status of 0.  OBJECTIVE PHYSICAL EXAMINATION:  Vital Signs:  Blood pressure is 131/77, pulse 85, respirations 20, temp 98.5, weight 145 pounds.  HEENT: Conjunctivae pink.  Sclerae anicteric.  Oropharynx is benign without oral mucositis or candidiasis.  Lungs:  Clear to auscultation without wheezing or rhonchi.  Heart:  Regular rate and rhythm without murmurs, rubs, gallops, or  clicks.  Breasts:  Bilateral breast reconstruction sites are well healed without any skin nodules or masses.  Abdomen: Soft, normal bowel sounds are present.  Extremities:  Free of pedal edema.  Neurologic Exam:  Nonfocal.  LABORATORY DATA:  Hemoglobin 9.8 g, platelet count 260,000, WBC 6200, with an ANC of 3400.  Chemistry panel pending.  IMPRESSION:  A 49-year-old [name], [name], woman with a history of a stage II triple-negative right breast carcinoma, for which she received neoadjuvant chemotherapy, followed by bilateral mastectomies with a right axillary lymph node dissection.  She is now on maintenance Herceptin and due to resume her tamoxifen 20 mg p.o. daily after bilateral oophorectomies and bowel resection at [facility] on 11/04/2010.  Case has been reviewed with Dr. Khan.  PLAN:  Proceed with treatment today as scheduled.  [name] will return in 3 weeks' time for a followup exam with Dr. Khan.  She knows to contact us in the interim if the need should arise.    ______________________________ Candelaria Pies, PA CS/MEDQ  D:  12/23/2010  T:  12/23/2010  Job:  [id number] 

## 2010-12-30 ENCOUNTER — Ambulatory Visit (HOSPITAL_COMMUNITY): Payer: Managed Care, Other (non HMO)

## 2011-01-13 ENCOUNTER — Telehealth: Payer: Self-pay | Admitting: *Deleted

## 2011-01-13 ENCOUNTER — Other Ambulatory Visit (HOSPITAL_BASED_OUTPATIENT_CLINIC_OR_DEPARTMENT_OTHER): Payer: Managed Care, Other (non HMO) | Admitting: Lab

## 2011-01-13 ENCOUNTER — Ambulatory Visit (HOSPITAL_BASED_OUTPATIENT_CLINIC_OR_DEPARTMENT_OTHER): Payer: Managed Care, Other (non HMO) | Admitting: Oncology

## 2011-01-13 ENCOUNTER — Other Ambulatory Visit: Payer: Self-pay | Admitting: Oncology

## 2011-01-13 ENCOUNTER — Ambulatory Visit (HOSPITAL_BASED_OUTPATIENT_CLINIC_OR_DEPARTMENT_OTHER): Payer: Managed Care, Other (non HMO)

## 2011-01-13 VITALS — BP 107/69 | HR 86 | Temp 98.4°F | Ht 66.0 in | Wt 147.4 lb

## 2011-01-13 DIAGNOSIS — Z170 Estrogen receptor positive status [ER+]: Secondary | ICD-10-CM

## 2011-01-13 DIAGNOSIS — Z5112 Encounter for antineoplastic immunotherapy: Secondary | ICD-10-CM

## 2011-01-13 DIAGNOSIS — Z9010 Acquired absence of unspecified breast and nipple: Secondary | ICD-10-CM

## 2011-01-13 DIAGNOSIS — C50919 Malignant neoplasm of unspecified site of unspecified female breast: Secondary | ICD-10-CM

## 2011-01-13 DIAGNOSIS — C50419 Malignant neoplasm of upper-outer quadrant of unspecified female breast: Secondary | ICD-10-CM

## 2011-01-13 LAB — CBC WITH DIFFERENTIAL/PLATELET
BASO%: 0.5 % (ref 0.0–2.0)
Eosinophils Absolute: 0.2 10*3/uL (ref 0.0–0.5)
LYMPH%: 31.3 % (ref 14.0–49.7)
MCHC: 31.9 g/dL (ref 31.5–36.0)
MONO#: 0.5 10*3/uL (ref 0.1–0.9)
NEUT#: 4.5 10*3/uL (ref 1.5–6.5)
Platelets: 263 10*3/uL (ref 145–400)
RBC: 3.62 10*6/uL — ABNORMAL LOW (ref 3.70–5.45)
RDW: 16 % — ABNORMAL HIGH (ref 11.2–14.5)
WBC: 7.6 10*3/uL (ref 3.9–10.3)
lymph#: 2.4 10*3/uL (ref 0.9–3.3)
nRBC: 0 % (ref 0–0)

## 2011-01-13 LAB — COMPREHENSIVE METABOLIC PANEL
ALT: 15 U/L (ref 0–35)
AST: 22 U/L (ref 0–37)
Calcium: 8.2 mg/dL — ABNORMAL LOW (ref 8.4–10.5)
Chloride: 101 mEq/L (ref 96–112)
Creatinine, Ser: 1.11 mg/dL — ABNORMAL HIGH (ref 0.50–1.10)
Sodium: 140 mEq/L (ref 135–145)
Total Protein: 6.1 g/dL (ref 6.0–8.3)

## 2011-01-13 MED ORDER — HEPARIN SOD (PORK) LOCK FLUSH 100 UNIT/ML IV SOLN
500.0000 [IU] | Freq: Once | INTRAVENOUS | Status: AC | PRN
  Administered 2011-01-13: 500 [IU]
  Filled 2011-01-13: qty 5

## 2011-01-13 MED ORDER — ACETAMINOPHEN 325 MG PO TABS
650.0000 mg | ORAL_TABLET | Freq: Once | ORAL | Status: AC
  Administered 2011-01-13: 650 mg via ORAL

## 2011-01-13 MED ORDER — SODIUM CHLORIDE 0.9 % IJ SOLN
10.0000 mL | INTRAMUSCULAR | Status: DC | PRN
  Administered 2011-01-13: 10 mL
  Filled 2011-01-13: qty 10

## 2011-01-13 MED ORDER — TRASTUZUMAB CHEMO INJECTION 440 MG
6.0000 mg/kg | Freq: Once | INTRAVENOUS | Status: AC
  Administered 2011-01-13: 420 mg via INTRAVENOUS
  Filled 2011-01-13: qty 20

## 2011-01-13 MED ORDER — SODIUM CHLORIDE 0.9 % IV SOLN
Freq: Once | INTRAVENOUS | Status: AC
  Administered 2011-01-13: 14:00:00 via INTRAVENOUS

## 2011-01-13 MED ORDER — DIPHENHYDRAMINE HCL 25 MG PO CAPS
50.0000 mg | ORAL_CAPSULE | Freq: Once | ORAL | Status: AC
  Administered 2011-01-13: 50 mg via ORAL

## 2011-01-13 NOTE — Patient Instructions (Signed)
1510-Pt discharged ambulatory with next appointment confirmed.  Pt aware to call with any questions or concerns.

## 2011-01-13 NOTE — Telephone Encounter (Signed)
gave patient appointment for 01-2011 thru 06-2011 gave to the patient in the chemo room

## 2011-01-13 NOTE — Progress Notes (Signed)
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  PRIOR THERAPY:  #1 patient was originally diagnosed in December 2011. She received neoadjuvant chemotherapy in the originally consisting of Taxotere carboplatinum and Herceptin from from December 2011 to March 2012.  #2 patient then underwent bilateral mastectomies on 07/27/2010 with the final pathology of the left breast showing benign breast tissue with fibrocystic changes and columnar hyperplasia no atypia or malignancy was noted one sentinel node was negative for metastatic disease or any kind of malignancies. 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  #3 patient declined any radiation therapy.  #4 patient was then begun on every 3 week Herceptin with tamoxifen 20 mg daily her  CURRENT THERAPY: Patient is seen today for Herceptin every 3 weeks. She is also continuing tamoxifen 20 mg daily.  INTERVAL HISTORY: [name] 50 y.o. female returns for followup visit today overall she seems to be doing well she is in great spirits she denies any fevers chills night sweats headaches shortness of breath chest pains palpitations no myalgias or arthralgias. She has no back pain no peripheral paresthesias peer overall she seems to be doing quite well without any problems he remainder of the 10 point review of systems is negative.  MEDICAL HISTORY: Past Medical History  Diagnosis Date  . Thyroid disease   . Breast cancer   . GERD (gastroesophageal reflux disease)   . Wears glasses   . Crohn's   . History of small bowel obstruction   . Depression     ALLERGIES:  is  allergic to augmentin and doxycycline.  MEDICATIONS:  Current Outpatient Prescriptions  Medication Sig Dispense Refill  . acetaminophen (TYLENOL) 500 MG tablet Take 500 mg by mouth every 6 (six) hours as needed.        Marland Kitchen amphetamine-dextroamphetamine (ADDERALL XR) 20 MG 24 hr capsule Ad lib.      Marland Kitchen aspirin-acetaminophen-caffeine (EXCEDRIN MIGRAINE) 250-250-65 MG per tablet Take 1 tablet by mouth every 6 (six) hours as needed.        Marland Kitchen levothyroxine (SYNTHROID, LEVOTHROID) 200 MCG tablet Take 175 mcg by mouth 1 day or 1 dose.       . promethazine (PHENERGAN) 25 MG tablet Take 25 mg by mouth every 6 (six) hours as needed.        . tamoxifen (NOLVADEX) 20 MG tablet Take 20 mg by mouth daily.        . traMADol (ULTRAM) 50 MG tablet Ad lib.      Marland Kitchen venlafaxine (EFFEXOR-XR) 75 MG 24 hr capsule Take 75 mg by mouth daily.         No current facility-administered medications for this visit.   Facility-Administered Medications Ordered in Other Visits  Medication Dose Route Frequency Provider Last Rate Last Dose  . 0.9 %  sodium chloride infusion   Intravenous Once Victorino December, MD      . acetaminophen (TYLENOL) tablet 650 mg  650 mg Oral Once Victorino December, MD   650 mg at 01/13/11 1344  . diphenhydrAMINE (BENADRYL) capsule 50 mg  50 mg Oral Once Victorino December, MD   50 mg at 01/13/11 1344  .  heparin lock flush 100 unit/mL  500 Units Intracatheter Once PRN Victorino December, MD   500 Units at 01/13/11 1504  . trastuzumab (HERCEPTIN) 420 mg in sodium chloride 0.9 % 250 mL chemo infusion  6 mg/kg (Treatment Plan Actual) Intravenous Once Victorino December, MD   420 mg at 01/13/11 1421  . DISCONTD: sodium chloride 0.9 % injection 10 mL  10 mL Intracatheter PRN Victorino December, MD   10 mL at 01/13/11 1504    SURGICAL HISTORY:  Past Surgical History  Procedure Date  . Colon resection 2000, 2001    with colostomy  . Mastectomy 2012    bilateral    REVIEW OF SYSTEMS:  A comprehensive review of  systems was negative.   PHYSICAL EXAMINATION: General appearance: alert, cooperative and appears stated age Head: Normocephalic, without obvious abnormality, atraumatic Neck: no adenopathy, no carotid bruit, no JVD, supple, symmetrical, trachea midline and thyroid not enlarged, symmetric, no tenderness/mass/nodules Lymph nodes: Cervical, supraclavicular, and axillary nodes normal. Resp: clear to auscultation bilaterally and normal percussion bilaterally Back: symmetric, no curvature. ROM normal. No CVA tenderness. Cardio: regular rate and rhythm, S1, S2 normal, no murmur, click, rub or gallop GI: soft, non-tender; bowel sounds normal; no masses,  no organomegaly Extremities: extremities normal, atraumatic, no cyanosis or edema Neurologic: Alert and oriented X 3, normal strength and tone. Normal symmetric reflexes. Normal coordination and gait Bilateral reconstructed breasts are examined there is no evidence of any kind and nodularity on the skin.  ECOG PERFORMANCE STATUS: 0 - Asymptomatic  Blood pressure 107/69, pulse 86, temperature 98.4 F (36.9 C), height 5\' 6"  (1.676 m), weight 147 lb 6.4 oz (66.86 kg).  LABORATORY DATA: Lab Results  Component Value Date   WBC 7.6 01/13/2011   HGB 9.9* 01/13/2011   HCT 31.0* 01/13/2011   MCV 85.6 01/13/2011   PLT 263 01/13/2011      Chemistry      Component Value Date/Time   NA 140 01/13/2011 1158   NA 140 01/13/2011 1158   K 4.2 01/13/2011 1158   K 4.2 01/13/2011 1158   CL 101 01/13/2011 1158   CL 101 01/13/2011 1158   CO2 28 01/13/2011 1158   CO2 28 01/13/2011 1158   BUN 22 01/13/2011 1158   BUN 22 01/13/2011 1158   CREATININE 1.11* 01/13/2011 1158   CREATININE 1.11* 01/13/2011 1158      Component Value Date/Time   CALCIUM 8.2* 01/13/2011 1158   CALCIUM 8.2* 01/13/2011 1158   ALKPHOS 69 01/13/2011 1158   ALKPHOS 69 01/13/2011 1158   AST 22 01/13/2011 1158   AST 22 01/13/2011 1158   ALT 15 01/13/2011 1158   ALT 15 01/13/2011  1158   BILITOT 0.3 01/13/2011 1158   BILITOT 0.3 01/13/2011 1158       RADIOGRAPHIC STUDIES:  No results found.  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 She is now continuing adjuvant Herceptin every 3 weeks. She will most likely finish up all of her therapy in May 2013. She is also on tamoxifen 20 mg daily and tolerating it quite well now.   PLAN: We will proceed with her scheduled Herceptin today. She will return every 3 weeks for Herceptin will. We will also continue to monitor her.   All questions were answered. The patient knows to call the clinic with any problems, questions or concerns. We can certainly see the patient much sooner  if necessary.  I spent 20 minutes counseling the patient face to face. The total time spent in the appointment was 30 minutes.    Drue Second, MD Medical/Oncology [facility] [phone] (beeper) [phone] (Office)  01/13/2011, 8:43 PM

## 2011-01-18 ENCOUNTER — Ambulatory Visit (HOSPITAL_BASED_OUTPATIENT_CLINIC_OR_DEPARTMENT_OTHER)
Admission: RE | Admit: 2011-01-18 | Discharge: 2011-01-18 | Disposition: A | Discharge status: Home / Self Care | Payer: Managed Care, Other (non HMO) | Source: Ambulatory Visit | Attending: Internal Medicine | Admitting: Internal Medicine

## 2011-01-18 ENCOUNTER — Ambulatory Visit (HOSPITAL_COMMUNITY)
Admission: RE | Admit: 2011-01-18 | Discharge: 2011-01-18 | Disposition: A | Discharge status: Home / Self Care | Payer: Managed Care, Other (non HMO) | Source: Ambulatory Visit | Attending: Internal Medicine | Admitting: Internal Medicine

## 2011-01-18 VITALS — BP 112/68 | HR 75 | Wt 145.2 lb

## 2011-01-18 DIAGNOSIS — C50919 Malignant neoplasm of unspecified site of unspecified female breast: Secondary | ICD-10-CM

## 2011-01-18 DIAGNOSIS — Z09 Encounter for follow-up examination after completed treatment for conditions other than malignant neoplasm: Secondary | ICD-10-CM

## 2011-01-18 DIAGNOSIS — I079 Rheumatic tricuspid valve disease, unspecified: Secondary | ICD-10-CM | POA: Insufficient documentation

## 2011-01-18 DIAGNOSIS — I509 Heart failure, unspecified: Secondary | ICD-10-CM | POA: Insufficient documentation

## 2011-01-18 DIAGNOSIS — I059 Rheumatic mitral valve disease, unspecified: Secondary | ICD-10-CM | POA: Insufficient documentation

## 2011-01-18 NOTE — Assessment & Plan Note (Signed)
She is doing well clinically. EF well preserved. We spent nearly an hour looking at all her echos. Initially it appeared that her lateral s' velocity was diminished but we repeater her study and it is well preserved. Thus will continue therapy. Repeat echo in 3 months.   Total time spent = 50 mins with over 70% of that time dedicated to reviewing her echos and discussing important parameters we are following.

## 2011-01-18 NOTE — Progress Notes (Signed)
*  PRELIMINARY RESULTS* Echocardiogram 2D Echocardiogram has been performed.  Glean Salen [facility] 01/18/2011, 9:35 AM

## 2011-01-18 NOTE — Progress Notes (Signed)
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  Diagnosed with R breast CA in November 2011. Treated with neoadjuvant chemo (TCH regimen). Underwent bilateral mastectomies June 12,2012 c/b delayed wound healing left breat. Pathology high-grade ductal CA of R breast 0/6 lymph nodes negative at surgery (but pre-op lymph node bx +) Triple receptor positive.  Pre-chemo echo read as EF 55% in 12/11.  Then treated with one or 2 doses Herceptin but this was stopped after echo 08/24/10: EF 50%. However, had f/u MUGA with EF 59%.  Echo 12/11 EF 55-60% s' 13. Echo 7/12 EF 55-60% s'16 Echo 01/18/11 EF 55-60% s'16 cm/s  Doing well from cardiac perspective. Continues to work as Estate agent. No CP/SOB/orthopnea. No edema.  ROS: All other systems normal except as mentioned in HPI, past medical history and problem list.    Past Medical History  Diagnosis Date  . Thyroid disease   . Breast cancer   . GERD (gastroesophageal reflux disease)   . Wears glasses   . Crohn's   . History of small bowel obstruction   . Depression     Current Outpatient Prescriptions  Medication Sig Dispense Refill  . acetaminophen (TYLENOL) 500 MG tablet Take 500 mg by mouth every 6 (six) hours as needed.        Marland Kitchen amphetamine-dextroamphetamine (ADDERALL XR) 20 MG 24 hr capsule Ad lib.      Marland Kitchen aspirin-acetaminophen-caffeine (EXCEDRIN MIGRAINE) 250-250-65 MG per tablet Take 1 tablet by mouth every 6 (six) hours as needed.        Marland Kitchen levothyroxine (SYNTHROID, LEVOTHROID) 200 MCG tablet Take 175 mcg by mouth 1 day or 1 dose.       . promethazine (PHENERGAN) 25 MG tablet Take 25 mg by mouth every 6 (six) hours as needed.        . tamoxifen (NOLVADEX) 20 MG tablet Take 20 mg by mouth daily.        . traMADol (ULTRAM) 50 MG tablet Ad lib.      Marland Kitchen venlafaxine (EFFEXOR) 37.5 MG tablet Take 37.5 mg by mouth daily.           Allergies  Allergen Reactions  . Augmentin     . Doxycycline     History   Social History  . Marital Status: Married    Spouse Name: N/A    Number of Children: N/A  . Years of Education: N/A   Occupational History  . Not on file.   Social History Main Topics  . Smoking status: Former Smoker    Quit date: 02/10/2007  . Smokeless tobacco: Not on file  . Alcohol Use: No  . Drug Use: No  . Sexually Active: Not on file   Other Topics Concern  . Not on file   Social History Narrative  . No narrative on file    Family History  Problem Relation Age of Onset  . Alzheimer's disease Mother   . Diabetes Father   . Hypertension Father   . Crohn's disease Sister     PHYSICAL EXAM: Filed Vitals:   01/18/11 1049  BP: 112/68  Pulse: 75   General:  Well appearing. No respiratory difficulty HEENT: normal Neck: supple. no JVD. Carotids 2+ bilat; no bruits. No lymphadenopathy or thryomegaly appreciated. Cor: PMI nondisplaced. Regular rate & rhythm. No rubs, gallops or murmurs. Lungs: clear Abdomen: soft, nontender, nondistended. No hepatosplenomegaly. No bruits or masses. Good bowel sounds. Extremities: no  cyanosis, clubbing, rash, edema Neuro: alert & oriented x 3, cranial nerves grossly intact. moves all 4 extremities w/o difficulty. Affect pleasant.  ASSESSMENT & PLAN:

## 2011-01-20 ENCOUNTER — Encounter (HOSPITAL_COMMUNITY): Payer: Managed Care, Other (non HMO)

## 2011-01-20 ENCOUNTER — Ambulatory Visit (HOSPITAL_COMMUNITY): Payer: Managed Care, Other (non HMO)

## 2011-02-03 ENCOUNTER — Encounter (HOSPITAL_COMMUNITY)
Admission: RE | Admit: 2011-02-03 | Discharge: 2011-02-03 | Disposition: A | Discharge status: Home / Self Care | Payer: Managed Care, Other (non HMO) | Source: Ambulatory Visit | Attending: Oncology | Admitting: Oncology

## 2011-02-03 ENCOUNTER — Ambulatory Visit (HOSPITAL_BASED_OUTPATIENT_CLINIC_OR_DEPARTMENT_OTHER): Payer: Managed Care, Other (non HMO) | Admitting: Physician Assistant

## 2011-02-03 ENCOUNTER — Ambulatory Visit: Payer: Managed Care, Other (non HMO)

## 2011-02-03 ENCOUNTER — Encounter: Payer: Self-pay | Admitting: Oncology

## 2011-02-03 ENCOUNTER — Ambulatory Visit (HOSPITAL_BASED_OUTPATIENT_CLINIC_OR_DEPARTMENT_OTHER): Payer: Managed Care, Other (non HMO)

## 2011-02-03 ENCOUNTER — Other Ambulatory Visit: Payer: Self-pay | Admitting: Oncology

## 2011-02-03 ENCOUNTER — Other Ambulatory Visit (HOSPITAL_BASED_OUTPATIENT_CLINIC_OR_DEPARTMENT_OTHER): Payer: Managed Care, Other (non HMO) | Admitting: Lab

## 2011-02-03 ENCOUNTER — Encounter: Payer: Self-pay | Admitting: *Deleted

## 2011-02-03 VITALS — BP 126/75 | HR 86 | Temp 97.1°F | Resp 20

## 2011-02-03 DIAGNOSIS — K5090 Crohn's disease, unspecified, without complications: Secondary | ICD-10-CM | POA: Insufficient documentation

## 2011-02-03 DIAGNOSIS — Z5111 Encounter for antineoplastic chemotherapy: Secondary | ICD-10-CM

## 2011-02-03 DIAGNOSIS — C50919 Malignant neoplasm of unspecified site of unspecified female breast: Secondary | ICD-10-CM

## 2011-02-03 DIAGNOSIS — Z09 Encounter for follow-up examination after completed treatment for conditions other than malignant neoplasm: Secondary | ICD-10-CM

## 2011-02-03 DIAGNOSIS — D649 Anemia, unspecified: Secondary | ICD-10-CM

## 2011-02-03 DIAGNOSIS — C50419 Malignant neoplasm of upper-outer quadrant of unspecified female breast: Secondary | ICD-10-CM

## 2011-02-03 DIAGNOSIS — Z5112 Encounter for antineoplastic immunotherapy: Secondary | ICD-10-CM

## 2011-02-03 DIAGNOSIS — D500 Iron deficiency anemia secondary to blood loss (chronic): Secondary | ICD-10-CM | POA: Insufficient documentation

## 2011-02-03 DIAGNOSIS — K5080 Crohn's disease of both small and large intestine without complications: Secondary | ICD-10-CM

## 2011-02-03 DIAGNOSIS — K50813 Crohn's disease of both small and large intestine with fistula: Secondary | ICD-10-CM

## 2011-02-03 HISTORY — DX: Anemia, unspecified: D64.9

## 2011-02-03 LAB — COMPREHENSIVE METABOLIC PANEL
ALT: 9 U/L (ref 0–35)
AST: 10 U/L (ref 0–37)
CO2: 27 mEq/L (ref 19–32)
Calcium: 8.6 mg/dL (ref 8.4–10.5)
Chloride: 104 mEq/L (ref 96–112)
Creatinine, Ser: 1.34 mg/dL — ABNORMAL HIGH (ref 0.50–1.10)
Potassium: 4.4 mEq/L (ref 3.5–5.3)
Sodium: 139 mEq/L (ref 135–145)
Total Protein: 5.5 g/dL — ABNORMAL LOW (ref 6.0–8.3)

## 2011-02-03 LAB — CBC WITH DIFFERENTIAL/PLATELET
BASO%: 0.9 % (ref 0.0–2.0)
Basophils Absolute: 0.1 10*3/uL (ref 0.0–0.1)
EOS%: 1.2 % (ref 0.0–7.0)
HCT: 23.3 % — ABNORMAL LOW (ref 34.8–46.6)
HGB: 7.1 g/dL — ABNORMAL LOW (ref 11.6–15.9)
LYMPH%: 33.4 % (ref 14.0–49.7)
MCH: 26.3 pg (ref 25.1–34.0)
MCHC: 30.5 g/dL — ABNORMAL LOW (ref 31.5–36.0)
MCV: 86.3 fL (ref 79.5–101.0)
MONO%: 8.2 % (ref 0.0–14.0)
NEUT%: 56.3 % (ref 38.4–76.8)
lymph#: 3.9 10*3/uL — ABNORMAL HIGH (ref 0.9–3.3)

## 2011-02-03 LAB — PREPARE RBC (CROSSMATCH)

## 2011-02-03 LAB — HOLD TUBE, BLOOD BANK

## 2011-02-03 MED ORDER — DIPHENHYDRAMINE HCL 50 MG/ML IJ SOLN: 25.0000 mg | Freq: Once | INTRAMUSCULAR | Status: AC

## 2011-02-03 MED ORDER — ACETAMINOPHEN 325 MG PO TABS
650.0000 mg | ORAL_TABLET | Freq: Once | ORAL | Status: AC
  Administered 2011-02-03: 650 mg via ORAL

## 2011-02-03 MED ORDER — SODIUM CHLORIDE 0.9 % IV SOLN: Freq: Once | INTRAVENOUS | Status: DC

## 2011-02-03 NOTE — Progress Notes (Signed)
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  PAST THERAPY:  Neoadjuvant chemotherapy comprised of Taxotere, carboplatin, and Herceptin from December 2008 through March 2012. She then underwent bilateral mastectomies July 27, 2010, with final pathology of the left breast showing benign breast tissue with fibrocystic change and columnar hyperplasia but no atypia or malignancy, sentinel node was clear for malignancy.  She also had undergone a right modified radical mastectomy of the right breast with axillary lymph node dissection revealing residual 0.2-cm high-grade DCIS with cleared nodes. She declined radiation therapy.  She is receiving q.3 week Herceptin and also on tamoxifen 20 mg per day.  SUBJECTIVE:  [name] is seen today in anticipation of her next q.3 week dose of Herceptin.  She is feeling poorly today.  She notes she has no energy, and that she frankly had "blood in her stool" all last week. She was seen by her primary care provider earlier in the week, at that point her dark stools had abated.  Labs were not checked at that point in time.  She denies any shortness of breath.  No frank chest pain.  No nausea issues at this point in time.  She denies any fevers, chills, or night sweats.  She is not scheduled to see Dr. Ewing Schlein Until after the 1st of the year.  REVIEW OF SYSTEMS:  Negative.  ALLERGIES:  Augmentin and doxycycline.  CURRENT MEDICATIONS:  Reviewed with the patient, as per EMR.  ECOG status of 1 objective.  PHYSICAL EXAM:  Vital signs:  Blood pressure is 116/80, pulse 106, respirations 20, temp 98.3, and weight 147 pounds. HEENT: Conjunctivae are pale.  Sclerae anicteric.  Oropharynx is benign.  Lungs are clear to auscultation.  No evidence of wheezing or rhonchi.  Heart:  Regular rate and rhythm without murmurs, rubs,  gallops, or clicks.  Abdomen:  Soft with normal bowel sounds present. Extremities are free of pedal edema. The palms are pale. Neurologic exam: Nonfocal.  LABORATORY DATA:  Hemoglobin is 7.1 g, platelet count 366,000, and WBC 11,600 with an ANC of 6500.  IMPRESSION: 1. A 50 year old [place], [place], woman with a history of a     stage II, ER/PR and her 2 positive right breast carcinoma for which     she received neoadjuvant chemotherapy followed by bilateral     mastectomies with a right axillary lymph node dissection, now on     maintenance Herceptin due for next dosing, continuing tamoxifen. 2. Symptomatic anemia, felt to be secondary to gastrointestinal bleed     from Crohn flare. The case been reviewed with Dr. Park Breed.  PLAN:  [name] will not receive her Herceptin, but she will be typed and cross for 2 units to be given today.  We have strongly encouraged her to follow up with Dr. Ewing Schlein in the near future.  We will also obtain a CT of the abdomen and pelvis to rule out pathology. [name] understands agrees with this plan.    ______________________________ Leah Nimrod, PA CS/MEDQ  D:  02/03/2011  T:  02/03/2011  Job:  [id number]

## 2011-02-03 NOTE — Patient Instructions (Signed)
CALL MD FOR PROBLEMS

## 2011-02-03 NOTE — Progress Notes (Signed)
Progress note dictated-CTS 

## 2011-02-04 ENCOUNTER — Telehealth: Payer: Self-pay | Admitting: *Deleted

## 2011-02-04 LAB — TYPE AND SCREEN
Antibody Screen: NEGATIVE
Unit division: 0

## 2011-02-04 NOTE — Telephone Encounter (Signed)
gave patient appointment for ct abd pelvis on 02-09-2011

## 2011-02-07 ENCOUNTER — Other Ambulatory Visit: Payer: Self-pay | Admitting: Certified Registered Nurse Anesthetist

## 2011-02-09 ENCOUNTER — Ambulatory Visit
Admission: RE | Admit: 2011-02-09 | Discharge: 2011-02-09 | Disposition: A | Discharge status: Home / Self Care | Payer: Managed Care, Other (non HMO) | Source: Ambulatory Visit | Attending: Physician Assistant | Admitting: Physician Assistant

## 2011-02-09 DIAGNOSIS — K5090 Crohn's disease, unspecified, without complications: Secondary | ICD-10-CM

## 2011-02-09 DIAGNOSIS — D649 Anemia, unspecified: Secondary | ICD-10-CM

## 2011-02-09 DIAGNOSIS — C50919 Malignant neoplasm of unspecified site of unspecified female breast: Secondary | ICD-10-CM

## 2011-02-09 DIAGNOSIS — K50813 Crohn's disease of both small and large intestine with fistula: Secondary | ICD-10-CM

## 2011-02-09 MED ORDER — IOHEXOL 300 MG/ML  SOLN
40.0000 mL | Freq: Once | INTRAMUSCULAR | Status: AC | PRN
  Administered 2011-02-09: 40 mL via ORAL

## 2011-02-09 MED ORDER — IOHEXOL 300 MG/ML  SOLN
100.0000 mL | Freq: Once | INTRAMUSCULAR | Status: AC | PRN
  Administered 2011-02-09: 100 mL via INTRAVENOUS

## 2011-02-17 ENCOUNTER — Other Ambulatory Visit: Payer: Self-pay | Admitting: Physician Assistant

## 2011-02-18 ENCOUNTER — Telehealth: Payer: Self-pay | Admitting: *Deleted

## 2011-02-18 NOTE — Telephone Encounter (Signed)
Per MD notified pt CT showed no acute abnormalities.

## 2011-02-18 NOTE — Telephone Encounter (Signed)
Message copied by GARNER, Gerald Leitz on Fri Feb 18, 2011 12:26 PM ------      Message from: Victorino December      Created: Mon Feb 14, 2011  1:36 PM       Call patient: let patient know CT scan showed no acute abnormalities

## 2011-02-24 ENCOUNTER — Other Ambulatory Visit: Payer: Self-pay | Admitting: Oncology

## 2011-02-24 ENCOUNTER — Ambulatory Visit (HOSPITAL_BASED_OUTPATIENT_CLINIC_OR_DEPARTMENT_OTHER): Payer: Managed Care, Other (non HMO)

## 2011-02-24 ENCOUNTER — Other Ambulatory Visit: Payer: Managed Care, Other (non HMO) | Admitting: Lab

## 2011-02-24 ENCOUNTER — Ambulatory Visit (HOSPITAL_BASED_OUTPATIENT_CLINIC_OR_DEPARTMENT_OTHER): Payer: Managed Care, Other (non HMO) | Admitting: Family

## 2011-02-24 ENCOUNTER — Ambulatory Visit: Payer: Managed Care, Other (non HMO)

## 2011-02-24 ENCOUNTER — Ambulatory Visit: Payer: Managed Care, Other (non HMO) | Admitting: Physician Assistant

## 2011-02-24 DIAGNOSIS — Z5112 Encounter for antineoplastic immunotherapy: Secondary | ICD-10-CM

## 2011-02-24 DIAGNOSIS — C50919 Malignant neoplasm of unspecified site of unspecified female breast: Secondary | ICD-10-CM

## 2011-02-24 DIAGNOSIS — F329 Major depressive disorder, single episode, unspecified: Secondary | ICD-10-CM

## 2011-02-24 DIAGNOSIS — F3289 Other specified depressive episodes: Secondary | ICD-10-CM

## 2011-02-24 LAB — CBC WITH DIFFERENTIAL/PLATELET
BASO%: 0.2 % (ref 0.0–2.0)
EOS%: 1.4 % (ref 0.0–7.0)
HCT: 28.6 % — ABNORMAL LOW (ref 34.8–46.6)
LYMPH%: 27.4 % (ref 14.0–49.7)
MCH: 25.8 pg (ref 25.1–34.0)
MCHC: 29.7 g/dL — ABNORMAL LOW (ref 31.5–36.0)
NEUT%: 63.1 % (ref 38.4–76.8)
RBC: 3.29 10*6/uL — ABNORMAL LOW (ref 3.70–5.45)
WBC: 5.7 10*3/uL (ref 3.9–10.3)
lymph#: 1.6 10*3/uL (ref 0.9–3.3)
nRBC: 0 % (ref 0–0)

## 2011-02-24 LAB — COMPREHENSIVE METABOLIC PANEL
ALT: 12 U/L (ref 0–35)
AST: 15 U/L (ref 0–37)
Alkaline Phosphatase: 61 U/L (ref 39–117)
BUN: 17 mg/dL (ref 6–23)
Creatinine, Ser: 0.89 mg/dL (ref 0.50–1.10)
Potassium: 4 mEq/L (ref 3.5–5.3)

## 2011-02-24 MED ORDER — SODIUM CHLORIDE 0.9 % IJ SOLN
10.0000 mL | INTRAMUSCULAR | Status: DC | PRN
  Administered 2011-02-24: 10 mL
  Filled 2011-02-24: qty 10

## 2011-02-24 MED ORDER — HEPARIN SOD (PORK) LOCK FLUSH 100 UNIT/ML IV SOLN
500.0000 [IU] | Freq: Once | INTRAVENOUS | Status: AC | PRN
  Administered 2011-02-24: 500 [IU]
  Filled 2011-02-24: qty 5

## 2011-02-24 MED ORDER — DIPHENHYDRAMINE HCL 25 MG PO CAPS
50.0000 mg | ORAL_CAPSULE | Freq: Once | ORAL | Status: AC
  Administered 2011-02-24: 50 mg via ORAL

## 2011-02-24 MED ORDER — ACETAMINOPHEN 325 MG PO TABS
650.0000 mg | ORAL_TABLET | Freq: Once | ORAL | Status: AC
  Administered 2011-02-24: 650 mg via ORAL

## 2011-02-24 MED ORDER — TRASTUZUMAB CHEMO INJECTION 440 MG
6.0000 mg/kg | Freq: Once | INTRAVENOUS | Status: AC
  Administered 2011-02-24: 420 mg via INTRAVENOUS
  Filled 2011-02-24: qty 20

## 2011-02-24 MED ORDER — SODIUM CHLORIDE 0.9 % IV SOLN
Freq: Once | INTRAVENOUS | Status: AC
  Administered 2011-02-24: 10:00:00 via INTRAVENOUS

## 2011-02-25 ENCOUNTER — Encounter: Payer: Self-pay | Admitting: Family

## 2011-02-25 DIAGNOSIS — F32A Depression, unspecified: Secondary | ICD-10-CM

## 2011-02-25 DIAGNOSIS — F329 Major depressive disorder, single episode, unspecified: Secondary | ICD-10-CM | POA: Insufficient documentation

## 2011-02-25 HISTORY — DX: Depression, unspecified: F32.A

## 2011-02-25 NOTE — Progress Notes (Signed)
[facility]  Name: [name]                  DATE: 02/25/2011 MRN: [id number]                      DOB: [birth year]  CC:          Richmond Campbell., PA  REFERRING PHYSICIAN: Richmond Campbell., PA  DIAGNOSIS: Patient Active Problem List  Diagnoses Date Noted  . Depression 02/25/2011  . Anemia 02/03/2011  . Breast cancer 09/13/2010  . 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  . Depression   . 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   PREVIOUS THERAPY: Neoadjuvant chemotherapy comprised of Taxotere,  carboplatin, and Herceptin from December 2008 through March 2012. Bilateral mastectomies July 27, 2010, with final pathology of the left breast showing benign breast tissue with  fibrocystic change and columnar hyperplasia but no atypia or malignancy,  sentinel node was clear for malignancy. 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  She declined radiation therapy.   CURRENT THERAPY: Herceptin every three weeks and tamoxifen 20 mg per day.  INTERIM HISTORY: Herceptin treatment was held December 20 for a low hemoglobin, she received 2 units of packed red blood cells. It was thought this was a flareup of Crohn's disease, with bright blood in the stool after bowel movement. She was encouraged to followup with GI, was given an appointment which that office canceled. She has an appointment February 4 for followup with GI.  Continues to feel poorly. Chief complaint is depression, has trouble with her job, basically says her life is "a mess". Denies descriptive thoughts, both towards herself and others.  She's had no headache or blurred vision, no cough or shortness of breath, no  abdominal pain, no new bone pain. No self detected problems with the breast.  PHYSICAL EXAM: BP 127/66  Pulse 93  Temp 98.1 F (36.7 C)  Ht 5\' 6"  (1.676 m)  Wt 155 lb 6.4 oz (70.489 kg)  BMI 25.08 kg/m2 General: Well developed, well nourished, in no acute distress.  EENT: No ocular or oral lesions. No stomatitis.  Respiratory: Lungs are clear to auscultation bilaterally with normal respiratory movement and no accessory muscle use. Cardiac: No murmur, rub or tachycardia. No upper or lower extremity edema.  GI: Abdomen is soft, no palpable hepatosplenomegaly. No fluid wave. No tenderness. Musculoskeletal: No kyphosis, no tenderness over the spine, ribs or hips. Lymph: No cervical, infraclavicular, axillary or inguinal adenopathy. Neuro: No focal neurological deficits. Psych: Alert and oriented X 3, flat mood and affect.   SOCIAL HISTORY:  . Marital Status: Married   Social History Main Topics  . Smoking status: Former Smoker    Quit date: 02/10/2007   LABORATORY STUDIES:    Lab Results  Component Value Date   WBC 5.7 02/24/2011   HGB 8.5* 02/24/2011   HCT 28.6* 02/24/2011   MCV 86.9 02/24/2011   PLT 297 02/24/2011     IMPRESSION:  51 year old white female with: 1.History of stage II right breast cancer, ER/PR positive HER-2 overexpressed. No evidence of recurrence. 2. Depression, managed by others. 3. GI blood loss, hemoglobin today 8.5.  Scheduled for GI followup February 4.  PLAN:   1. Herceptin treatment today. 2. Return to clinic January 31 for lab, and Herceptin treatment, an appointment with Dr. Park Breed.

## 2011-03-17 ENCOUNTER — Other Ambulatory Visit (HOSPITAL_BASED_OUTPATIENT_CLINIC_OR_DEPARTMENT_OTHER): Payer: Managed Care, Other (non HMO) | Admitting: Lab

## 2011-03-17 ENCOUNTER — Ambulatory Visit (HOSPITAL_BASED_OUTPATIENT_CLINIC_OR_DEPARTMENT_OTHER): Payer: Managed Care, Other (non HMO) | Admitting: Oncology

## 2011-03-17 ENCOUNTER — Ambulatory Visit (HOSPITAL_BASED_OUTPATIENT_CLINIC_OR_DEPARTMENT_OTHER): Payer: Managed Care, Other (non HMO)

## 2011-03-17 ENCOUNTER — Telehealth: Payer: Self-pay | Admitting: *Deleted

## 2011-03-17 VITALS — BP 134/70 | HR 110 | Temp 98.7°F | Ht 66.0 in | Wt 157.4 lb

## 2011-03-17 DIAGNOSIS — C50419 Malignant neoplasm of upper-outer quadrant of unspecified female breast: Secondary | ICD-10-CM

## 2011-03-17 DIAGNOSIS — Z5112 Encounter for antineoplastic immunotherapy: Secondary | ICD-10-CM

## 2011-03-17 DIAGNOSIS — J069 Acute upper respiratory infection, unspecified: Secondary | ICD-10-CM

## 2011-03-17 DIAGNOSIS — C50919 Malignant neoplasm of unspecified site of unspecified female breast: Secondary | ICD-10-CM

## 2011-03-17 DIAGNOSIS — K5289 Other specified noninfective gastroenteritis and colitis: Secondary | ICD-10-CM

## 2011-03-17 DIAGNOSIS — D649 Anemia, unspecified: Secondary | ICD-10-CM

## 2011-03-17 DIAGNOSIS — Z170 Estrogen receptor positive status [ER+]: Secondary | ICD-10-CM

## 2011-03-17 DIAGNOSIS — K529 Noninfective gastroenteritis and colitis, unspecified: Secondary | ICD-10-CM

## 2011-03-17 LAB — CBC WITH DIFFERENTIAL/PLATELET
Basophils Absolute: 0 10*3/uL (ref 0.0–0.1)
EOS%: 1 % (ref 0.0–7.0)
HCT: 30.1 % — ABNORMAL LOW (ref 34.8–46.6)
HGB: 9 g/dL — ABNORMAL LOW (ref 11.6–15.9)
LYMPH%: 29.1 % (ref 14.0–49.7)
MCH: 23.9 pg — ABNORMAL LOW (ref 25.1–34.0)
MCV: 80.1 fL (ref 79.5–101.0)
MONO%: 7.7 % (ref 0.0–14.0)
NEUT%: 61.7 % (ref 38.4–76.8)

## 2011-03-17 MED ORDER — ACETAMINOPHEN 325 MG PO TABS
650.0000 mg | ORAL_TABLET | Freq: Once | ORAL | Status: AC
  Administered 2011-03-17: 650 mg via ORAL

## 2011-03-17 MED ORDER — DIPHENHYDRAMINE HCL 25 MG PO CAPS
50.0000 mg | ORAL_CAPSULE | Freq: Once | ORAL | Status: AC
  Administered 2011-03-17: 50 mg via ORAL

## 2011-03-17 MED ORDER — TRASTUZUMAB CHEMO INJECTION 440 MG
6.0000 mg/kg | Freq: Once | INTRAVENOUS | Status: AC
  Administered 2011-03-17: 420 mg via INTRAVENOUS
  Filled 2011-03-17: qty 20

## 2011-03-17 MED ORDER — SODIUM CHLORIDE 0.9 % IV SOLN: Freq: Once | INTRAVENOUS | Status: DC

## 2011-03-17 NOTE — Telephone Encounter (Signed)
changed all christine schere appointments to nancy rudolph

## 2011-03-20 NOTE — Progress Notes (Signed)
OFFICE PROGRESS NOTE    [name], PA, PA-C [address] [place] [address] Chevis Pretty, MD Lonie Peak, MD Dr. 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  PRIOR THERAPY:  #1 patient was originally diagnosed in December 2011. She received neoadjuvant chemotherapy in the originally consisting of Taxotere carboplatinum and Herceptin from from December 2011 to March 2012.  #2 patient then underwent bilateral mastectomies on 07/27/2010 with the final pathology of the left breast showing benign breast tissue with fibrocystic changes and columnar hyperplasia no atypia or malignancy was noted one sentinel node was negative for metastatic disease or any kind of malignancies. 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  #3 patient declined any radiation therapy.  #4 patient was then begun on every 3 week Herceptin with tamoxifen 20 mg daily   CURRENT THERAPY: Patient is seen today for Herceptin every 3 weeks. She is also continuing tamoxifen 20 mg daily.  INTERVAL HISTORY: [name] 51 y.o. female returns for followup visit today overall she seems to be doing well she is in great spirits she denies any fevers chills night sweats headaches shortness of breath chest pains palpitations no myalgias or arthralgias. She has no back pain no peripheral paresthesias  overall she seems to be doing quite well without any problems related to her treatments. She is experiencing hot flashes. She continues to see Dr. Gala Romney. She has had some episodes of upper respiratory and GI problems for the last few days for she she was not able to go to work. She is asking for a note for work.The remainder  of the 10 point review of systems is negative.  MEDICAL HISTORY: Past Medical History  Diagnosis Date  . Thyroid disease   . Breast cancer   . GERD (gastroesophageal reflux disease)   . Wears glasses   . Crohn's   . History of small bowel obstruction   . Depression   . Anemia 02/03/2011  . Depression 02/25/2011    ALLERGIES:  is allergic to augmentin and doxycycline.  MEDICATIONS:  Current Outpatient Prescriptions  Medication Sig Dispense Refill  . acetaminophen (TYLENOL) 500 MG tablet Take 500 mg by mouth every 6 (six) hours as needed.        Marland Kitchen amphetamine-dextroamphetamine (ADDERALL XR) 20 MG 24 hr capsule Ad lib.      Marland Kitchen aspirin-acetaminophen-caffeine (EXCEDRIN MIGRAINE) 250-250-65 MG per tablet Take 1 tablet by mouth every 6 (six) hours as needed.        Marland Kitchen levothyroxine (SYNTHROID, LEVOTHROID) 200 MCG tablet Take 175 mcg by mouth 1 day or 1 dose.       Marland Kitchen omeprazole (PRILOSEC) 20 MG capsule Take 20 mg by mouth daily.      . promethazine (PHENERGAN) 25 MG tablet Take 25 mg by mouth every 6 (six) hours as needed.        . tamoxifen (NOLVADEX) 20 MG tablet Take 20 mg by mouth daily.        . traMADol (ULTRAM) 50 MG tablet Ad lib.      Marland Kitchen venlafaxine (EFFEXOR) 37.5 MG tablet Take 75 mg by mouth daily.        No current facility-administered medications for this visit.   Facility-Administered Medications Ordered in Other  Visits  Medication Dose Route Frequency Provider Last Rate Last Dose  . 0.9 %  sodium chloride infusion   Intravenous Once Victorino December, MD      . diphenhydrAMINE (BENADRYL) injection 25 mg  25 mg Intravenous Once Victorino December, MD        SURGICAL HISTORY:  Past Surgical History  Procedure Date  . Colon resection 2000, 2001    with colostomy  . Mastectomy 2012    bilateral    REVIEW OF SYSTEMS:  A comprehensive review of systems was negative.   PHYSICAL EXAMINATION: General appearance: alert, cooperative and appears stated age Head: Normocephalic,  without obvious abnormality, atraumatic Neck: no adenopathy, no carotid bruit, no JVD, supple, symmetrical, trachea midline and thyroid not enlarged, symmetric, no tenderness/mass/nodules Lymph nodes: Cervical, supraclavicular, and axillary nodes normal. Resp: clear to auscultation bilaterally and normal percussion bilaterally Back: symmetric, no curvature. ROM normal. No CVA tenderness. Cardio: regular rate and rhythm, S1, S2 normal, no murmur, click, rub or gallop GI: soft, non-tender; bowel sounds normal; no masses,  no organomegaly Extremities: extremities normal, atraumatic, no cyanosis or edema Neurologic: Alert and oriented X 3, normal strength and tone. Normal symmetric reflexes. Normal coordination and gait Bilateral reconstructed breasts are examined there is no evidence of any kind and nodularity on the skin.  ECOG PERFORMANCE STATUS: 0 - Asymptomatic  Blood pressure 134/70, pulse 110, temperature 98.7 F (37.1 C), temperature source Oral, height 5\' 6"  (1.676 m), weight 157 lb 6.4 oz (71.396 kg).  LABORATORY DATA: Lab Results  Component Value Date   WBC 8.0 03/17/2011   HGB 9.0* 03/17/2011   HCT 30.1* 03/17/2011   MCV 80.1 03/17/2011   PLT 311 03/17/2011      Chemistry      Component Value Date/Time   NA 141 02/24/2011 0928   K 4.0 02/24/2011 0928   CL 109 02/24/2011 0928   CO2 23 02/24/2011 0928   BUN 17 02/24/2011 0928   CREATININE 0.89 02/24/2011 0928      Component Value Date/Time   CALCIUM 8.0* 02/24/2011 0928   ALKPHOS 61 02/24/2011 0928   AST 15 02/24/2011 0928   ALT 12 02/24/2011 0928   BILITOT 0.2* 02/24/2011 0928       RADIOGRAPHIC STUDIES:  No results found.  ASSESSMENT: 51 year old female with:  1.  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   2. She is now continuing adjuvant Herceptin every 3 weeks. She will most likely finish up all of her therapy in May 2013.   3. She is also  on tamoxifen 20 mg daily and tolerating it quite well now.  4. Possible URI and Gi bug  5. Anemia slightly better   PLAN:  1. We will proceed with her scheduled Herceptin today.   2. She will return every 3 weeks for Herceptin will.   3. We will also continue to monitor her.   All questions were answered. The patient knows to call the clinic with any problems, questions or concerns. We can certainly see the patient much sooner if necessary.  I spent 20 minutes counseling the patient face to face. The total time spent in the appointment was 30 minutes.    Drue Second, MD Medical/Oncology [facility] [phone] (beeper) [phone] (Office)  03/20/2011, 12:24 PM

## 2011-03-22 ENCOUNTER — Telehealth: Payer: Self-pay | Admitting: Oncology

## 2011-03-22 NOTE — Telephone Encounter (Signed)
Left message for patient to call me back. 

## 2011-03-23 ENCOUNTER — Telehealth: Payer: Self-pay | Admitting: Oncology

## 2011-03-23 NOTE — Progress Notes (Signed)
Encounter addended by: Noralee Space, RN on: 03/23/2011  1:22 PM<BR>     Documentation filed: Orders

## 2011-03-23 NOTE — Telephone Encounter (Signed)
Spoke with patient about applying for co-pay assistance. Patient has email I am emailing patient the information for the co-pay assistance foundation. ([employer] Co-pay program)

## 2011-04-06 ENCOUNTER — Other Ambulatory Visit: Payer: Self-pay | Admitting: Family

## 2011-04-06 DIAGNOSIS — C50919 Malignant neoplasm of unspecified site of unspecified female breast: Secondary | ICD-10-CM

## 2011-04-07 ENCOUNTER — Other Ambulatory Visit (HOSPITAL_BASED_OUTPATIENT_CLINIC_OR_DEPARTMENT_OTHER): Payer: Managed Care, Other (non HMO) | Admitting: Lab

## 2011-04-07 ENCOUNTER — Ambulatory Visit (HOSPITAL_BASED_OUTPATIENT_CLINIC_OR_DEPARTMENT_OTHER): Payer: Managed Care, Other (non HMO) | Admitting: Family

## 2011-04-07 ENCOUNTER — Ambulatory Visit (HOSPITAL_BASED_OUTPATIENT_CLINIC_OR_DEPARTMENT_OTHER): Payer: Managed Care, Other (non HMO)

## 2011-04-07 ENCOUNTER — Ambulatory Visit: Payer: Managed Care, Other (non HMO) | Admitting: Physician Assistant

## 2011-04-07 ENCOUNTER — Encounter: Payer: Self-pay | Admitting: Family

## 2011-04-07 VITALS — BP 130/78 | HR 97 | Temp 98.3°F | Ht 66.0 in | Wt 158.3 lb

## 2011-04-07 DIAGNOSIS — C50419 Malignant neoplasm of upper-outer quadrant of unspecified female breast: Secondary | ICD-10-CM

## 2011-04-07 DIAGNOSIS — C50919 Malignant neoplasm of unspecified site of unspecified female breast: Secondary | ICD-10-CM

## 2011-04-07 DIAGNOSIS — F329 Major depressive disorder, single episode, unspecified: Secondary | ICD-10-CM

## 2011-04-07 DIAGNOSIS — Z5112 Encounter for antineoplastic immunotherapy: Secondary | ICD-10-CM

## 2011-04-07 LAB — CBC WITH DIFFERENTIAL/PLATELET
Eosinophils Absolute: 0 10*3/uL (ref 0.0–0.5)
LYMPH%: 11.9 % — ABNORMAL LOW (ref 14.0–49.7)
MCH: 24.3 pg — ABNORMAL LOW (ref 25.1–34.0)
MCHC: 29.3 g/dL — ABNORMAL LOW (ref 31.5–36.0)
MCV: 82.9 fL (ref 79.5–101.0)
MONO%: 2 % (ref 0.0–14.0)
NEUT#: 6.3 10*3/uL (ref 1.5–6.5)
Platelets: 262 10*3/uL (ref 145–400)
RBC: 3.91 10*6/uL (ref 3.70–5.45)
nRBC: 0 % (ref 0–0)

## 2011-04-07 MED ORDER — LETROZOLE 2.5 MG PO TABS: 2.5000 mg | ORAL_TABLET | Freq: Every day | ORAL | Status: DC

## 2011-04-07 MED ORDER — DIPHENHYDRAMINE HCL 25 MG PO CAPS
50.0000 mg | ORAL_CAPSULE | Freq: Once | ORAL | Status: AC
  Administered 2011-04-07: 50 mg via ORAL

## 2011-04-07 MED ORDER — HEPARIN SOD (PORK) LOCK FLUSH 100 UNIT/ML IV SOLN
500.0000 [IU] | Freq: Once | INTRAVENOUS | Status: AC | PRN
  Administered 2011-04-07: 500 [IU]
  Filled 2011-04-07: qty 5

## 2011-04-07 MED ORDER — SODIUM CHLORIDE 0.9 % IV SOLN
Freq: Once | INTRAVENOUS | Status: AC
  Administered 2011-04-07: 10:00:00 via INTRAVENOUS

## 2011-04-07 MED ORDER — VENLAFAXINE HCL ER 75 MG PO CP24: 150.0000 mg | ORAL_CAPSULE | Freq: Every day | ORAL | Status: DC

## 2011-04-07 MED ORDER — ACETAMINOPHEN 325 MG PO TABS
650.0000 mg | ORAL_TABLET | Freq: Once | ORAL | Status: AC
  Administered 2011-04-07: 650 mg via ORAL

## 2011-04-07 MED ORDER — SODIUM CHLORIDE 0.9 % IJ SOLN
10.0000 mL | INTRAMUSCULAR | Status: DC | PRN
  Administered 2011-04-07: 10 mL
  Filled 2011-04-07: qty 10

## 2011-04-07 MED ORDER — TRASTUZUMAB CHEMO INJECTION 440 MG
6.0000 mg/kg | Freq: Once | INTRAVENOUS | Status: AC
  Administered 2011-04-07: 420 mg via INTRAVENOUS
  Filled 2011-04-07: qty 20

## 2011-04-07 NOTE — Patient Instructions (Signed)
[facility] Discharge Instructions for Patients Receiving Chemotherapy  Today you received the following chemotherapy agents Herceptin  To help prevent nausea and vomiting after your treatment, we encourage you to take your nausea medication as directed by MD   If you develop nausea and vomiting that is not controlled by your nausea medication, call the clinic. If it is after clinic hours your family physician or the after hours number for the clinic or go to the Emergency Department.   BELOW ARE SYMPTOMS THAT SHOULD BE REPORTED IMMEDIATELY:  *FEVER GREATER THAN 100.5 F  *CHILLS WITH OR WITHOUT FEVER  NAUSEA AND VOMITING THAT IS NOT CONTROLLED WITH YOUR NAUSEA MEDICATION  *UNUSUAL SHORTNESS OF BREATH  *UNUSUAL BRUISING OR BLEEDING  TENDERNESS IN MOUTH AND THROAT WITH OR WITHOUT PRESENCE OF ULCERS  *URINARY PROBLEMS  *BOWEL PROBLEMS  UNUSUAL RASH Items with * indicate a potential emergency and should be followed up as soon as possible.  One of the nurses will contact you 24 hours after your first treatment. Please let the nurse know about any problems that you may have experienced. Feel free to call the clinic you have any questions or concerns. The clinic phone number is [phone].   I have been informed and understand all the instructions given to me. I know to contact the clinic, my physician, or go to the Emergency Department if any problems should occur. I do not have any questions at this time, but understand that I may call the clinic during office hours   should I have any questions or need assistance in obtaining follow up care.    __________________________________________  _____________  __________ Signature of Patient or Authorized Representative            Date                   Time    __________________________________________ Nurse's Signature    

## 2011-04-07 NOTE — Progress Notes (Signed)
[facility]  Name: [name]                  DATE: 04/07/2011 MRN: [id number]                      DOB: [birth year]  CC:          Richmond Campbell., PA  REFERRING PHYSICIAN: Richmond Campbell., PA  DIAGNOSIS: Patient Active Problem List  Diagnoses Date Noted  . Depression 02/25/2011  . Anemia 02/03/2011  . Breast cancer 09/13/2010  . 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  . Depression Yes  . 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   PREVIOUS THERAPY: Neoadjuvant chemotherapy comprised of Taxotere,  carboplatin, and Herceptin from December 2008 through March 2012. Bilateral mastectomies July 27, 2010, with final pathology of the left breast showing benign breast tissue with  fibrocystic change and columnar hyperplasia but no atypia or malignancy,  sentinel node was clear for malignancy. 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  She declined radiation therapy.   CURRENT THERAPY: Herceptin every three weeks and tamoxifen 20 mg per day.  INTERIM HISTORY: Continues to feel poorly. 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 Denies destructive thoughts, towards herself or others.   She's had no headache or blurred vision, no cough or shortness of breath, no abdominal pain, no new bone pain. No self detected problems with the breast. Reports numerous hot flashes during the day, some 2-3 per hour. Currently on Effexor 75 mg daily. Remains on Tamoxifen, ask about a different therapy option, in light of hot flashes. Has had oophorectomy.   PHYSICAL EXAM: BP 130/78  Pulse 97  Temp 98.3 F (36.8 C)  Ht 5\' 6"   (1.676 m)  Wt 158 lb 4.8 oz (71.804 kg)  BMI 25.55 kg/m2 General: Well developed, well nourished, in no acute distress.  EENT: No ocular or oral lesions. No stomatitis.  Respiratory: Lungs are clear to auscultation bilaterally with normal respiratory movement and no accessory muscle use. Cardiac: No murmur, rub or tachycardia. No upper or lower extremity edema.  GI: Abdomen is soft, no palpable hepatosplenomegaly. No fluid wave. No tenderness. Musculoskeletal: No kyphosis, no tenderness over the spine, ribs or hips. Lymph: No cervical, infraclavicular, axillary or inguinal adenopathy. Neuro: No focal neurological deficits. Psych: Alert and oriented X 3, flat mood and affect. Tearful at times when discussing home and work life.   SOCIAL HISTORY:  . Marital Status: Married   Social History Main Topics  . Smoking status: Former Smoker    Quit date: 02/10/2007   LABORATORY STUDIES:    Lab Results  Component Value Date   WBC 7.4 04/07/2011   HGB 9.5* 04/07/2011   HCT 32.4* 04/07/2011   MCV 82.9 04/07/2011   PLT 262 04/07/2011     IMPRESSION:  51 year old white female with: 1.History of stage II right breast cancer, ER/PR positive HER-2 overexpressed. No evidence of recurrence. 2. Depression. 3. Histoy GI blood loss, hemoglobin today 9.5. Is followed by GI for Crohn's disease.  PLAN:   1. Herceptin treatment today. 2. Return to  clinic March 14 for lab, Herceptin treatment, and appointment with me. 3. Increase Effexor to 150 mg daily at her request.  4. Discontinue Tamoxifen, prescription for Letrozole 2.5 mg daily.   Prescriptions are e-scribed to [facility].

## 2011-04-13 ENCOUNTER — Other Ambulatory Visit: Payer: Self-pay | Admitting: *Deleted

## 2011-04-13 MED ORDER — PROMETHAZINE HCL 25 MG PO TABS: 25.0000 mg | ORAL_TABLET | Freq: Four times a day (QID) | ORAL | Status: DC | PRN

## 2011-04-28 ENCOUNTER — Other Ambulatory Visit (HOSPITAL_BASED_OUTPATIENT_CLINIC_OR_DEPARTMENT_OTHER): Payer: Managed Care, Other (non HMO) | Admitting: Lab

## 2011-04-28 ENCOUNTER — Ambulatory Visit: Payer: Managed Care, Other (non HMO) | Admitting: Physician Assistant

## 2011-04-28 ENCOUNTER — Ambulatory Visit (HOSPITAL_BASED_OUTPATIENT_CLINIC_OR_DEPARTMENT_OTHER): Payer: Managed Care, Other (non HMO)

## 2011-04-28 ENCOUNTER — Encounter: Payer: Self-pay | Admitting: Family

## 2011-04-28 ENCOUNTER — Ambulatory Visit (HOSPITAL_BASED_OUTPATIENT_CLINIC_OR_DEPARTMENT_OTHER): Payer: Managed Care, Other (non HMO) | Admitting: Family

## 2011-04-28 VITALS — BP 131/77 | HR 96 | Temp 98.5°F | Ht 66.0 in | Wt 164.0 lb

## 2011-04-28 DIAGNOSIS — Z5112 Encounter for antineoplastic immunotherapy: Secondary | ICD-10-CM

## 2011-04-28 DIAGNOSIS — C50919 Malignant neoplasm of unspecified site of unspecified female breast: Secondary | ICD-10-CM

## 2011-04-28 DIAGNOSIS — Z170 Estrogen receptor positive status [ER+]: Secondary | ICD-10-CM

## 2011-04-28 DIAGNOSIS — C50419 Malignant neoplasm of upper-outer quadrant of unspecified female breast: Secondary | ICD-10-CM

## 2011-04-28 DIAGNOSIS — K5090 Crohn's disease, unspecified, without complications: Secondary | ICD-10-CM

## 2011-04-28 DIAGNOSIS — D500 Iron deficiency anemia secondary to blood loss (chronic): Secondary | ICD-10-CM

## 2011-04-28 LAB — CBC WITH DIFFERENTIAL/PLATELET
Basophils Absolute: 0 10*3/uL (ref 0.0–0.1)
EOS%: 0 % (ref 0.0–7.0)
HCT: 32.8 % — ABNORMAL LOW (ref 34.8–46.6)
HGB: 10.4 g/dL — ABNORMAL LOW (ref 11.6–15.9)
LYMPH%: 9.3 % — ABNORMAL LOW (ref 14.0–49.7)
MCH: 25.8 pg (ref 25.1–34.0)
MONO#: 0.1 10*3/uL (ref 0.1–0.9)
NEUT%: 89.2 % — ABNORMAL HIGH (ref 38.4–76.8)
Platelets: 249 10*3/uL (ref 145–400)
lymph#: 0.8 10*3/uL — ABNORMAL LOW (ref 0.9–3.3)

## 2011-04-28 MED ORDER — TRASTUZUMAB CHEMO INJECTION 440 MG
6.0000 mg/kg | Freq: Once | INTRAVENOUS | Status: AC
  Administered 2011-04-28: 420 mg via INTRAVENOUS
  Filled 2011-04-28: qty 20

## 2011-04-28 MED ORDER — DIPHENHYDRAMINE HCL 25 MG PO CAPS
50.0000 mg | ORAL_CAPSULE | Freq: Once | ORAL | Status: AC
  Administered 2011-04-28: 50 mg via ORAL

## 2011-04-28 MED ORDER — LIDOCAINE-PRILOCAINE 2.5-2.5 % EX CREA: TOPICAL_CREAM | CUTANEOUS | Status: DC | PRN

## 2011-04-28 MED ORDER — HEPARIN SOD (PORK) LOCK FLUSH 100 UNIT/ML IV SOLN
500.0000 [IU] | Freq: Once | INTRAVENOUS | Status: AC | PRN
  Administered 2011-04-28: 500 [IU]
  Filled 2011-04-28: qty 5

## 2011-04-28 MED ORDER — ACETAMINOPHEN 325 MG PO TABS
650.0000 mg | ORAL_TABLET | Freq: Once | ORAL | Status: AC
  Administered 2011-04-28: 650 mg via ORAL

## 2011-04-28 MED ORDER — SODIUM CHLORIDE 0.9 % IV SOLN
Freq: Once | INTRAVENOUS | Status: AC
  Administered 2011-04-28: 10:00:00 via INTRAVENOUS

## 2011-04-28 MED ORDER — SODIUM CHLORIDE 0.9 % IJ SOLN
10.0000 mL | INTRAMUSCULAR | Status: DC | PRN
  Filled 2011-04-28: qty 10

## 2011-04-28 NOTE — Progress Notes (Signed)
[facility]  Name: [name]                  DATE: 04/28/2011 MRN: [id number]                      DOB: 10-Dec-1960  CC:          Richmond Campbell., PA  REFERRING PHYSICIAN: Richmond Campbell., PA  DIAGNOSIS: Patient Active Problem List  Diagnoses Date Noted  . Depression 02/25/2011  . Anemia 02/03/2011  . Breast cancer 09/13/2010  . 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  . 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   PREVIOUS THERAPY: Neoadjuvant chemotherapy comprised of Taxotere,  carboplatin, and Herceptin through March 2012. Bilateral mastectomies July 27, 2010, with final pathology of the left breast showing benign breast tissue with  fibrocystic change and columnar hyperplasia but no atypia or malignancy,  sentinel node was clear for malignancy. 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  Declined radiation therapy.   CURRENT THERAPY: Herceptin every three weeks and Letrozole 2.5 mg daily. Will complete Herceptin June 2013,   INTERIM HISTORY: Continues to feel poorly, although somewhat improved over last visit. On last visit, I increased Effexor from 75 mg daily to 150 mg daily. Chief complaint is depression related to trouble with her job, and home life. Denies destructive thoughts, towards herself or others.   No headache or blurred vision, no cough or shortness of breath, no abdominal pain, no new bone pain. No self detected problems with the breast. Reports numerous hot flashes during the day, improved with increased dose of Effexor and discontinuation of Tamoxifen in favor of Letrozole. No appreciable side effects from Letrozole.    PHYSICAL EXAM: BP 131/77   Pulse 96  Temp(Src) 98.5 F (36.9 C) (Oral)  Ht 5\' 6"  (1.676 m)  Wt 164 lb (74.39 kg)  BMI 26.47 kg/m2 General: Well developed, well nourished, in no acute distress.  EENT: No ocular or oral lesions. No stomatitis.  Respiratory: Lungs are clear to auscultation bilaterally with normal respiratory movement and no accessory muscle use. Cardiac: No murmur, rub or tachycardia. No upper or lower extremity edema.  GI: Abdomen is soft, no palpable hepatosplenomegaly. No fluid wave. No tenderness. Musculoskeletal: No kyphosis, no tenderness over the spine, ribs or hips. Lymph: No cervical, infraclavicular, axillary or inguinal adenopathy. Neuro: No focal neurological deficits. Psych: Alert and oriented X 3, flat mood and affect. More animated than on previous visits.    SOCIAL HISTORY:  . Marital Status: Married   Social History Main Topics  . Smoking status: Former Smoker    Quit date: 02/10/2007   LABORATORY STUDIES:    Lab Results  Component Value Date   WBC 8.4 04/28/2011   HGB 10.4* 04/28/2011   HCT 32.8* 04/28/2011   MCV 81.4 04/28/2011   PLT 249 04/28/2011     IMPRESSION:  51 year old white female with: 1.History of stage II right breast cancer, ER/PR positive HER-2 overexpressed. No evidence of recurrence. 2. Depression. 3. Histoy GI blood loss, hemoglobin today 10.4 (improved over last visit). Is followed by GI for Crohn's disease.  PLAN:   1. Herceptin treatment today.  2. Return to clinic April 4 for lab, Herceptin treatment, and appointment with me. 3. Continue Letrozole 2.5 mg daily.  4. Last Herceptin treatment will be 6/6.  5. Prescription for Emla cream, escribed.   Prescriptions are e-scribed to [facility].

## 2011-04-28 NOTE — Patient Instructions (Signed)
[facility] Discharge Instructions for Patients Receiving Chemotherapy  Today you received the following chemotherapy agent : Herceptin  To help prevent nausea and vomiting after your treatment, we encourage you to take your nausea medication, Phenergan 25 mg as needed.    If you develop nausea and vomiting that is not controlled by your nausea medication, call the clinic. If it is after clinic hours your family physician or the after hours number for the clinic or go to the Emergency Department.   BELOW ARE SYMPTOMS THAT SHOULD BE REPORTED IMMEDIATELY:  *FEVER GREATER THAN 100.5 F  *CHILLS WITH OR WITHOUT FEVER  NAUSEA AND VOMITING THAT IS NOT CONTROLLED WITH YOUR NAUSEA MEDICATION  *UNUSUAL SHORTNESS OF BREATH  *UNUSUAL BRUISING OR BLEEDING  TENDERNESS IN MOUTH AND THROAT WITH OR WITHOUT PRESENCE OF ULCERS  *URINARY PROBLEMS  *BOWEL PROBLEMS  UNUSUAL RASH Items with * indicate a potential emergency and should be followed up as soon as possible.   Feel free to call the clinic you have any questions or concerns. The clinic phone number is [phone].   I have been informed and understand all the instructions given to me. I know to contact the clinic, my physician, or go to the Emergency Department if any problems should occur. I do not have any questions at this time, but understand that I may call the clinic during office hours   should I have any questions or need assistance in obtaining follow up care.    __________________________________________  _____________  __________ Signature of Patient or Authorized Representative            Date                   Time    __________________________________________ Nurse's Signature

## 2011-05-03 ENCOUNTER — Ambulatory Visit (HOSPITAL_COMMUNITY)
Admission: RE | Admit: 2011-05-03 | Discharge: 2011-05-03 | Disposition: A | Discharge status: Home / Self Care | Payer: Managed Care, Other (non HMO) | Source: Ambulatory Visit | Attending: Internal Medicine | Admitting: Internal Medicine

## 2011-05-03 ENCOUNTER — Other Ambulatory Visit: Payer: Self-pay | Admitting: Oncology

## 2011-05-03 VITALS — BP 126/68 | HR 95 | Wt 163.0 lb

## 2011-05-03 DIAGNOSIS — C50919 Malignant neoplasm of unspecified site of unspecified female breast: Secondary | ICD-10-CM

## 2011-05-03 DIAGNOSIS — I079 Rheumatic tricuspid valve disease, unspecified: Secondary | ICD-10-CM | POA: Insufficient documentation

## 2011-05-03 DIAGNOSIS — I509 Heart failure, unspecified: Secondary | ICD-10-CM | POA: Insufficient documentation

## 2011-05-03 DIAGNOSIS — I059 Rheumatic mitral valve disease, unspecified: Secondary | ICD-10-CM

## 2011-05-03 DIAGNOSIS — I379 Nonrheumatic pulmonary valve disorder, unspecified: Secondary | ICD-10-CM | POA: Insufficient documentation

## 2011-05-03 MED ORDER — CARVEDILOL 3.125 MG PO TABS: 3.1250 mg | ORAL_TABLET | Freq: Two times a day (BID) | ORAL | Status: DC

## 2011-05-03 NOTE — Progress Notes (Signed)
  Echocardiogram 2D Echocardiogram has been performed.  Sherron Mapp, Real Cons 05/03/2011, 10:23 AM

## 2011-05-03 NOTE — Patient Instructions (Addendum)
Add carvedilol 3.125 mg twice daily.  Will schedule MUGA scan at [facility] on 3/26 at 1:45p.  Continue herceptin therapy at this time.  Follow up in 2 months with echo.

## 2011-05-03 NOTE — Progress Notes (Signed)
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  Diagnosed with R breast CA in November 2011. Treated with neoadjuvant chemo (TCH regimen). Underwent bilateral mastectomies June 12,2012 c/b delayed wound healing left breat. Pathology high-grade ductal CA of R breast 0/6 lymph nodes negative at surgery (but pre-op lymph node bx +) Triple receptor positive.  Pre-chemo echo read as EF 55% in 12/11.  Then treated with one or 2 doses Herceptin but this was stopped after echo 08/24/10: EF 50%. However, had f/u MUGA with EF 59%.  Echo 12/11 EF 55-60% s' 13. Echo 7/12 EF 55-60% s'16 Echo 01/18/11 EF 50-55% s'16 cm/s Echo 05/03/11 EF 45-50% lateral s' 10 cm/s  She is here for follow up today.  She is having some problems with depression, effexor was increased to 150 mg daily ~ 2 months ago.  She has had a lot of weight gain since this time.  She occasionally has swelling in her lower extremities sometimes after standing all day.  Continues to get herceptin every 3 weeks.  Will complete herceptin June 2013.  She is tolerating this well.  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  Denies CP/SOB/orthopnea.  She has been asked to see a psychiatrist, she is waiting on timing/money before scheduling.     ROS: All other systems normal except as mentioned in HPI, past medical history and problem list.    Past Medical History  Diagnosis Date  . Thyroid disease   . Breast cancer   . GERD (gastroesophageal reflux disease)   . Wears glasses   . Crohn's   . History of small bowel obstruction   . Depression   . Anemia 02/03/2011  . Depression 02/25/2011    Current Outpatient Prescriptions  Medication Sig Dispense Refill  . acetaminophen (TYLENOL) 500 MG tablet Take 500 mg by mouth every 6 (six) hours as needed.        Marland Kitchen amphetamine-dextroamphetamine (ADDERALL XR) 20 MG 24 hr capsule Ad lib.      Marland Kitchen aspirin-acetaminophen-caffeine  (EXCEDRIN MIGRAINE) 250-250-65 MG per tablet Take 1 tablet by mouth every 6 (six) hours as needed.        Marland Kitchen dexlansoprazole (DEXILANT) 60 MG capsule Take 60 mg by mouth as needed.      Marland Kitchen levothyroxine (SYNTHROID, LEVOTHROID) 200 MCG tablet Take 175 mcg by mouth 1 day or 1 dose.       . lidocaine-prilocaine (EMLA) cream Apply topically as needed.  30 g  0  . promethazine (PHENERGAN) 25 MG tablet Take 1 tablet (25 mg total) by mouth every 6 (six) hours as needed.  30 tablet  2  . traMADol (ULTRAM) 50 MG tablet Take 50 mg by mouth every 6 (six) hours as needed.      . venlafaxine (EFFEXOR-XR) 75 MG 24 hr capsule Take 2 capsules (150 mg total) by mouth daily.  60 capsule  3   No current facility-administered medications for this encounter.   Facility-Administered Medications Ordered in Other Encounters  Medication Dose Route Frequency Provider Last Rate Last Dose  . 0.9 %  sodium chloride infusion   Intravenous Once Victorino December, MD      . diphenhydrAMINE (BENADRYL) injection 25 mg  25 mg Intravenous Once Victorino December, MD         Allergies  Allergen Reactions  . Augmentin   . Doxycycline     PHYSICAL EXAM: Filed Vitals:  05/03/11 1030  BP: 126/68  Pulse: 95  Weight: 163 lb (73.936 kg)  SpO2: 93%    General:  Well appearing. No respiratory difficulty HEENT: normal Neck: supple. no JVD. Carotids 2+ bilat; no bruits. No lymphadenopathy or thryomegaly appreciated. Cor: PMI nondisplaced. Regular rate & rhythm. No rubs, gallops or murmurs. Lungs: clear Abdomen: soft, nontender, nondistended. No hepatosplenomegaly. No bruits or masses. Good bowel sounds. Extremities: no cyanosis, clubbing, rash, edema Neuro: alert & oriented x 3, cranial nerves grossly intact. moves all 4 extremities w/o difficulty. Affect pleasant.  ASSESSMENT & PLAN:

## 2011-05-03 NOTE — Assessment & Plan Note (Addendum)
She is doing well clinically but it appears her EF is mildly depressed.  Although the windows on her echo are difficult so unsure if this is accurate.  Will proceed with a MUGA scan to calculate ejection fraction, if it is within 5% on 50-55% will proceed with herceptin therapy.  Will place her on carvedilol 3.125 mg BID at this time.  Will follow up in 2 months with echo.    Patient seen and examined with Ulyess Blossom PA-C. We discussed all aspects of the encounter. I agree with the assessment and plan as stated above. Echos difficult to interpret. LV function seems somewhat labile. No clinical HF. Will proceed with MUGA to help [name] sort this out. May also be worth considering a cardiac MRI at some point as well.

## 2011-05-03 NOTE — Assessment & Plan Note (Signed)
Continue effexor.  Encouraged her to follow up with psychiatry for therapy.

## 2011-05-10 ENCOUNTER — Ambulatory Visit (HOSPITAL_COMMUNITY): Payer: Managed Care, Other (non HMO) | Attending: Cardiology | Admitting: Radiology

## 2011-05-10 VITALS — Ht 66.0 in | Wt 164.0 lb

## 2011-05-10 DIAGNOSIS — Z09 Encounter for follow-up examination after completed treatment for conditions other than malignant neoplasm: Secondary | ICD-10-CM

## 2011-05-10 DIAGNOSIS — Z87891 Personal history of nicotine dependence: Secondary | ICD-10-CM | POA: Insufficient documentation

## 2011-05-10 DIAGNOSIS — C50919 Malignant neoplasm of unspecified site of unspecified female breast: Secondary | ICD-10-CM

## 2011-05-10 MED ORDER — TECHNETIUM TC 99M-LABELED RED BLOOD CELLS IV KIT
33.0000 | PACK | Freq: Once | INTRAVENOUS | Status: AC | PRN
  Administered 2011-05-10: 33 via INTRAVENOUS

## 2011-05-10 NOTE — Progress Notes (Signed)
Muga Study  Referring Provider:  Dolores Patty, MD  Date of Procedure: 05/10/2011  Indication: Evaluation for ischemia, determine EF after Chemo  History: 6/12: Right Breast cancer, bilateral mastectomies 08/30/10 MUGA: EF= 60% 05/03/11 Echo: EF=50%  Cardiac Risk Factors: History of Smoking  22g IV access established Left AC by Stanton Kidney, EMT-P.  Muga Information:  The patient's red blood cells were labeled using the Ultra Tag method with 32.8 mci of Technetium 37m Pertechnetate.  The images were reconstructed in the Anterior, Lateral and Left Anterior oblique views.  Impression: EF 64; Normal wall motion.  Olga Millers

## 2011-05-18 ENCOUNTER — Telehealth: Payer: Self-pay | Admitting: *Deleted

## 2011-05-18 NOTE — Telephone Encounter (Signed)
left voice message to inform the patient of the time change for 05-19-2011 starting at 9:30am

## 2011-05-19 ENCOUNTER — Ambulatory Visit: Payer: Managed Care, Other (non HMO) | Admitting: Family

## 2011-05-19 ENCOUNTER — Other Ambulatory Visit (HOSPITAL_BASED_OUTPATIENT_CLINIC_OR_DEPARTMENT_OTHER): Payer: Managed Care, Other (non HMO) | Admitting: Lab

## 2011-05-19 ENCOUNTER — Other Ambulatory Visit: Payer: Managed Care, Other (non HMO) | Admitting: Lab

## 2011-05-19 ENCOUNTER — Ambulatory Visit (HOSPITAL_BASED_OUTPATIENT_CLINIC_OR_DEPARTMENT_OTHER): Payer: Managed Care, Other (non HMO)

## 2011-05-19 ENCOUNTER — Ambulatory Visit: Payer: Managed Care, Other (non HMO) | Admitting: Physician Assistant

## 2011-05-19 VITALS — BP 138/81 | HR 78 | Temp 98.1°F

## 2011-05-19 DIAGNOSIS — C50419 Malignant neoplasm of upper-outer quadrant of unspecified female breast: Secondary | ICD-10-CM

## 2011-05-19 DIAGNOSIS — C50919 Malignant neoplasm of unspecified site of unspecified female breast: Secondary | ICD-10-CM

## 2011-05-19 DIAGNOSIS — Z5112 Encounter for antineoplastic immunotherapy: Secondary | ICD-10-CM

## 2011-05-19 LAB — CBC WITH DIFFERENTIAL/PLATELET
BASO%: 1 % (ref 0.0–2.0)
EOS%: 3 % (ref 0.0–7.0)
HCT: 37.2 % (ref 34.8–46.6)
LYMPH%: 35.8 % (ref 14.0–49.7)
MCH: 26.2 pg (ref 25.1–34.0)
MCHC: 30.4 g/dL — ABNORMAL LOW (ref 31.5–36.0)
MCV: 86.3 fL (ref 79.5–101.0)
MONO%: 10.7 % (ref 0.0–14.0)
NEUT%: 49.5 % (ref 38.4–76.8)
Platelets: 302 10*3/uL (ref 145–400)

## 2011-05-19 MED ORDER — ACETAMINOPHEN 325 MG PO TABS
650.0000 mg | ORAL_TABLET | Freq: Once | ORAL | Status: AC
  Administered 2011-05-19: 650 mg via ORAL

## 2011-05-19 MED ORDER — SODIUM CHLORIDE 0.9 % IV SOLN
Freq: Once | INTRAVENOUS | Status: AC
  Administered 2011-05-19: 50 mL via INTRAVENOUS

## 2011-05-19 MED ORDER — HEPARIN SOD (PORK) LOCK FLUSH 100 UNIT/ML IV SOLN
500.0000 [IU] | Freq: Once | INTRAVENOUS | Status: AC | PRN
  Administered 2011-05-19: 500 [IU]
  Filled 2011-05-19: qty 5

## 2011-05-19 MED ORDER — SODIUM CHLORIDE 0.9 % IJ SOLN
10.0000 mL | INTRAMUSCULAR | Status: DC | PRN
  Administered 2011-05-19: 10 mL
  Filled 2011-05-19: qty 10

## 2011-05-19 MED ORDER — TRASTUZUMAB CHEMO INJECTION 440 MG
6.0000 mg/kg | Freq: Once | INTRAVENOUS | Status: AC
  Administered 2011-05-19: 420 mg via INTRAVENOUS
  Filled 2011-05-19: qty 20

## 2011-05-19 MED ORDER — DIPHENHYDRAMINE HCL 25 MG PO CAPS
50.0000 mg | ORAL_CAPSULE | Freq: Once | ORAL | Status: AC
  Administered 2011-05-19: 50 mg via ORAL

## 2011-05-19 NOTE — Progress Notes (Signed)
Pt. Arrived for chemo.  Questioned about f/u with MD./  " I always have to see the doctor".  Verified with Dr. Milta Deiters nurse, no f/u today.  MD off.  Pt. Made aware.  C/o'ed of fatigue and same numbness and tingling to rt. Side due to hx of disc problems.  Also has unresolved  Mouthsore.  Educatioln done NF:AOZHY care.  Pt. Informed and aware to call MD with problems or questions. HL

## 2011-05-19 NOTE — Patient Instructions (Signed)
 Cancer Center Discharge Instructions for Patients Receiving Chemotherapy  Today you received the following chemotherapy agents Herceptin To help prevent nausea and vomiting after your treatment, we encourage you to take your nausea medication as prescribed by Dr. Welton Flakes.   If you develop nausea and vomiting that is not controlled by your nausea medication, call the clinic. If it is after clinic hours your family physician or the after hours number for the clinic or go to the Emergency Department.   BELOW ARE SYMPTOMS THAT SHOULD BE REPORTED IMMEDIATELY:  *FEVER GREATER THAN 100.5 F  *CHILLS WITH OR WITHOUT FEVER  NAUSEA AND VOMITING THAT IS NOT CONTROLLED WITH YOUR NAUSEA MEDICATION  *UNUSUAL SHORTNESS OF BREATH  *UNUSUAL BRUISING OR BLEEDING  TENDERNESS IN MOUTH AND THROAT WITH OR WITHOUT PRESENCE OF ULCERS  *URINARY PROBLEMS  *BOWEL PROBLEMS  UNUSUAL RASH Items with * indicate a potential emergency and should be followed up as soon as possible.   Feel free to call the clinic you have any questions or concerns. The clinic phone number is [phone].   I have been informed and understand all the instructions given to me. I know to contact the clinic, my physician, or go to the Emergency Department if any problems should occur. I do not have any questions at this time, but understand that I may call the clinic during office hours   should I have any questions or need assistance in obtaining follow up care.    __________________________________________  _____________  __________ Signature of Patient or Authorized Representative            Date                   Time    __________________________________________ Nurse's Signature

## 2011-06-08 ENCOUNTER — Other Ambulatory Visit: Payer: Managed Care, Other (non HMO) | Admitting: Lab

## 2011-06-09 ENCOUNTER — Other Ambulatory Visit (HOSPITAL_BASED_OUTPATIENT_CLINIC_OR_DEPARTMENT_OTHER): Payer: Managed Care, Other (non HMO) | Admitting: Lab

## 2011-06-09 ENCOUNTER — Ambulatory Visit (HOSPITAL_COMMUNITY)
Admission: RE | Admit: 2011-06-09 | Discharge: 2011-06-09 | Disposition: A | Discharge status: Home / Self Care | Payer: Managed Care, Other (non HMO) | Source: Ambulatory Visit | Attending: Family | Admitting: Family

## 2011-06-09 ENCOUNTER — Ambulatory Visit (HOSPITAL_BASED_OUTPATIENT_CLINIC_OR_DEPARTMENT_OTHER): Payer: Managed Care, Other (non HMO)

## 2011-06-09 ENCOUNTER — Ambulatory Visit (HOSPITAL_BASED_OUTPATIENT_CLINIC_OR_DEPARTMENT_OTHER): Payer: Managed Care, Other (non HMO) | Admitting: Family

## 2011-06-09 ENCOUNTER — Telehealth: Payer: Self-pay | Admitting: Oncology

## 2011-06-09 ENCOUNTER — Encounter: Payer: Self-pay | Admitting: Family

## 2011-06-09 VITALS — BP 114/68 | HR 89 | Temp 97.5°F | Ht 66.0 in | Wt 164.7 lb

## 2011-06-09 DIAGNOSIS — R059 Cough, unspecified: Secondary | ICD-10-CM | POA: Insufficient documentation

## 2011-06-09 DIAGNOSIS — Z853 Personal history of malignant neoplasm of breast: Secondary | ICD-10-CM | POA: Insufficient documentation

## 2011-06-09 DIAGNOSIS — C50419 Malignant neoplasm of upper-outer quadrant of unspecified female breast: Secondary | ICD-10-CM

## 2011-06-09 DIAGNOSIS — C50919 Malignant neoplasm of unspecified site of unspecified female breast: Secondary | ICD-10-CM

## 2011-06-09 DIAGNOSIS — R05 Cough: Secondary | ICD-10-CM | POA: Insufficient documentation

## 2011-06-09 DIAGNOSIS — Z170 Estrogen receptor positive status [ER+]: Secondary | ICD-10-CM

## 2011-06-09 DIAGNOSIS — Z87891 Personal history of nicotine dependence: Secondary | ICD-10-CM | POA: Insufficient documentation

## 2011-06-09 DIAGNOSIS — IMO0001 Reserved for inherently not codable concepts without codable children: Secondary | ICD-10-CM

## 2011-06-09 DIAGNOSIS — Z5112 Encounter for antineoplastic immunotherapy: Secondary | ICD-10-CM

## 2011-06-09 LAB — CBC WITH DIFFERENTIAL/PLATELET
EOS%: 5.2 % (ref 0.0–7.0)
LYMPH%: 21.5 % (ref 14.0–49.7)
MCH: 27.1 pg (ref 25.1–34.0)
MCV: 84.8 fL (ref 79.5–101.0)
MONO%: 7.8 % (ref 0.0–14.0)
RBC: 3.88 10*6/uL (ref 3.70–5.45)
RDW: 18.4 % — ABNORMAL HIGH (ref 11.2–14.5)
nRBC: 0 % (ref 0–0)

## 2011-06-09 MED ORDER — ACETAMINOPHEN 325 MG PO TABS
650.0000 mg | ORAL_TABLET | Freq: Once | ORAL | Status: AC
  Administered 2011-06-09: 650 mg via ORAL

## 2011-06-09 MED ORDER — SODIUM CHLORIDE 0.9 % IV SOLN
Freq: Once | INTRAVENOUS | Status: AC
  Administered 2011-06-09: 10:00:00 via INTRAVENOUS

## 2011-06-09 MED ORDER — DIPHENHYDRAMINE HCL 25 MG PO CAPS
50.0000 mg | ORAL_CAPSULE | Freq: Once | ORAL | Status: AC
  Administered 2011-06-09: 50 mg via ORAL

## 2011-06-09 MED ORDER — SODIUM CHLORIDE 0.9 % IJ SOLN
10.0000 mL | INTRAMUSCULAR | Status: DC | PRN
  Administered 2011-06-09: 10 mL
  Filled 2011-06-09: qty 10

## 2011-06-09 MED ORDER — TRASTUZUMAB CHEMO INJECTION 440 MG
6.0000 mg/kg | Freq: Once | INTRAVENOUS | Status: AC
  Administered 2011-06-09: 420 mg via INTRAVENOUS
  Filled 2011-06-09: qty 20

## 2011-06-09 MED ORDER — HEPARIN SOD (PORK) LOCK FLUSH 100 UNIT/ML IV SOLN
500.0000 [IU] | Freq: Once | INTRAVENOUS | Status: AC | PRN
  Administered 2011-06-09: 500 [IU]
  Filled 2011-06-09: qty 5

## 2011-06-09 NOTE — Progress Notes (Signed)
[facility]  Name: [name]                  DATE: 06/09/2011 MRN: [id number]                      DOB: 1961/02/11  CC:          Richmond Campbell., PA  REFERRING PHYSICIAN: Richmond Campbell., PA  DIAGNOSIS: Patient Active Problem List  Diagnoses Date Noted  . Depression 02/25/2011  . Anemia 02/03/2011  . Breast cancer 09/13/2010  . 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  . Cough, persistent Yes  . 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   PREVIOUS THERAPY: Neoadjuvant chemotherapy comprised of Taxotere, carboplatin, and Herceptin through March 2012. Bilateral mastectomies July 27, 2010, with final pathology of the left breast showing benign breast tissue with  fibrocystic change and columnar hyperplasia but no atypia or malignancy, sentinel node was clear for malignancy. 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 Declined radiation therapy. Took Tamoxifen, discontinued March 2013 for hot flashes and began Letrozole 2.5 mg daily.   CURRENT THERAPY: Herceptin every three weeks and Letrozole 2.5 mg daily. Will complete Herceptin June 2013.   INTERIM HISTORY: Continues to feel poorly, chief complaint is arthralgias/myalgias since initiating letrozole. Took several days off work, thinking she had the flu, with no improvement. 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 Having difficulty walking.   No headache or blurred vision. New onset chronic, nonproductive cough, wakes her at night. Has smoking history, quit 4 years ago. No increased shortness of breath, no abdominal pain, no new bone pain. No self detected problems with the breast. Reports numerous hot flashes during the day,  improved with increased dose of Effexor.   Last echo 3/19, EF 50%.   PHYSICAL EXAM: BP 114/68  Pulse 89  Temp 97.5 F (36.4 C)  Ht 5\' 6"  (1.676 m)  Wt 164 lb 11.2 oz (74.707 kg)  BMI 26.58 kg/m2 General: Well developed, well nourished, in no acute distress.  EENT: No ocular or oral lesions. No stomatitis.  Respiratory: Lungs are clear to auscultation bilaterally with normal respiratory movement and no accessory muscle use. Cardiac: No murmur, rub or tachycardia. No upper or lower extremity edema.  GI: Abdomen is soft, no palpable hepatosplenomegaly. No fluid wave. No tenderness. Musculoskeletal: No kyphosis, no tenderness over the spine, ribs or hips. Lymph: No cervical, infraclavicular, axillary or inguinal adenopathy. Neuro: No focal neurological deficits. Psych: Alert and oriented X 3, flat mood and affect. More animated than on previous visits.    SOCIAL HISTORY:  . Marital Status: Married   Social History Main Topics  . Smoking status: Former Smoker    Quit date: 02/10/2007   LABORATORY STUDIES:    Lab Results  Component Value Date   WBC 7.3 06/09/2011   HGB 10.5* 06/09/2011   HCT 32.9* 06/09/2011   MCV 84.8 06/09/2011   PLT 247 06/09/2011     IMPRESSION:  51 year old white female with: 1. History of stage II right breast cancer, ER/PR positive HER-2 overexpressed. No evidence of recurrence. 2. Depression, improved on Effexor. 3. Histoy GI blood loss. Is followed by GI for  Crohn's disease. 4. New onset intractable, dry cough.  Smoking history.  5. Significant arthralgias/myalgias on aromatase inhibitor.   PLAN:   1. Herceptin treatment today.  2. Return to clinic May 16 for lab, Herceptin treatment, and appointment with Dr. Welton Flakes.  3. Discontinue Letrozole 2.5 mg daily. Will reassess on next visit.  4. Last Herceptin treatment will be 6/6.  5. Chest x-ray today.

## 2011-06-09 NOTE — Telephone Encounter (Signed)
gve the pt her June 2013 appt calendar. Sent michelle an email through the staff messages to add the pt in for an herceptin tx on 07/21/2011

## 2011-06-10 ENCOUNTER — Telehealth: Payer: Self-pay | Admitting: *Deleted

## 2011-06-10 NOTE — Telephone Encounter (Signed)
Per staff message I have scheduled the patient a treatment time after her MD visit. Cyrstal aware appt in computer.  JMW

## 2011-06-10 NOTE — Telephone Encounter (Signed)
Informed patient per Colman Cater, NP that chest x-ray is normal and Robitussin can be used if cough continues.  Patient expressed understanding

## 2011-06-10 NOTE — Telephone Encounter (Signed)
Message copied by Anvita Hirata, Gerald Leitz on Fri Jun 10, 2011  9:06 AM ------      Message from: [name]      Created: Thu Jun 09, 2011  4:15 PM       Please call and tell her chest x-ray is normal. Can use Robitussin DM for cough if persists.       NR

## 2011-06-30 ENCOUNTER — Other Ambulatory Visit (HOSPITAL_BASED_OUTPATIENT_CLINIC_OR_DEPARTMENT_OTHER): Payer: Managed Care, Other (non HMO) | Admitting: Lab

## 2011-06-30 ENCOUNTER — Ambulatory Visit (HOSPITAL_BASED_OUTPATIENT_CLINIC_OR_DEPARTMENT_OTHER): Payer: Managed Care, Other (non HMO) | Admitting: Oncology

## 2011-06-30 ENCOUNTER — Telehealth: Payer: Self-pay | Admitting: Oncology

## 2011-06-30 ENCOUNTER — Encounter: Payer: Self-pay | Admitting: Oncology

## 2011-06-30 ENCOUNTER — Ambulatory Visit (HOSPITAL_BASED_OUTPATIENT_CLINIC_OR_DEPARTMENT_OTHER): Payer: Managed Care, Other (non HMO)

## 2011-06-30 VITALS — BP 115/66 | HR 85 | Temp 97.9°F | Ht 66.0 in | Wt 164.2 lb

## 2011-06-30 DIAGNOSIS — C50419 Malignant neoplasm of upper-outer quadrant of unspecified female breast: Secondary | ICD-10-CM

## 2011-06-30 DIAGNOSIS — C50919 Malignant neoplasm of unspecified site of unspecified female breast: Secondary | ICD-10-CM

## 2011-06-30 DIAGNOSIS — Z170 Estrogen receptor positive status [ER+]: Secondary | ICD-10-CM

## 2011-06-30 DIAGNOSIS — Z5112 Encounter for antineoplastic immunotherapy: Secondary | ICD-10-CM

## 2011-06-30 LAB — CBC WITH DIFFERENTIAL/PLATELET
BASO%: 0.5 % (ref 0.0–2.0)
EOS%: 4.1 % (ref 0.0–7.0)
MCH: 27.8 pg (ref 25.1–34.0)
MCHC: 31.8 g/dL (ref 31.5–36.0)
MONO#: 0.6 10*3/uL (ref 0.1–0.9)
NEUT%: 61.5 % (ref 38.4–76.8)
RBC: 4.1 10*6/uL (ref 3.70–5.45)
RDW: 16.4 % — ABNORMAL HIGH (ref 11.2–14.5)
WBC: 8.6 10*3/uL (ref 3.9–10.3)
lymph#: 2.3 10*3/uL (ref 0.9–3.3)
nRBC: 0 % (ref 0–0)

## 2011-06-30 MED ORDER — DULOXETINE HCL 20 MG PO CPEP: 20.0000 mg | ORAL_CAPSULE | Freq: Every day | ORAL | Status: DC

## 2011-06-30 MED ORDER — ACETAMINOPHEN 325 MG PO TABS
650.0000 mg | ORAL_TABLET | Freq: Once | ORAL | Status: AC
  Administered 2011-06-30: 650 mg via ORAL

## 2011-06-30 MED ORDER — TRASTUZUMAB CHEMO INJECTION 440 MG
6.0000 mg/kg | Freq: Once | INTRAVENOUS | Status: AC
  Administered 2011-06-30: 420 mg via INTRAVENOUS
  Filled 2011-06-30: qty 20

## 2011-06-30 MED ORDER — HEPARIN SOD (PORK) LOCK FLUSH 100 UNIT/ML IV SOLN
500.0000 [IU] | Freq: Once | INTRAVENOUS | Status: AC | PRN
  Administered 2011-06-30: 500 [IU]
  Filled 2011-06-30: qty 5

## 2011-06-30 MED ORDER — SODIUM CHLORIDE 0.9 % IV SOLN
Freq: Once | INTRAVENOUS | Status: AC
  Administered 2011-06-30: 11:00:00 via INTRAVENOUS

## 2011-06-30 MED ORDER — DIPHENHYDRAMINE HCL 25 MG PO CAPS
50.0000 mg | ORAL_CAPSULE | Freq: Once | ORAL | Status: AC
  Administered 2011-06-30: 50 mg via ORAL

## 2011-06-30 MED ORDER — SODIUM CHLORIDE 0.9 % IJ SOLN
10.0000 mL | INTRAMUSCULAR | Status: DC | PRN
Start: 2011-06-30 — End: 2011-06-30
  Administered 2011-06-30: 10 mL
  Filled 2011-06-30: qty 10

## 2011-06-30 NOTE — Telephone Encounter (Signed)
gve the pt's husband the mri brain appt at [facility] imaging

## 2011-06-30 NOTE — Progress Notes (Signed)
OFFICE PROGRESS NOTE    [name], PA, PA-C [address] [place] [address] Chevis Pretty, MD Lonie Peak, MD Dr. 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  PRIOR THERAPY:  #1 patient was originally diagnosed in December 2011. She received neoadjuvant chemotherapy in the originally consisting of Taxotere carboplatinum and Herceptin from from December 2011 to March 2012.  #2 patient then underwent bilateral mastectomies on 07/27/2010 with the final pathology of the left breast showing benign breast tissue with fibrocystic changes and columnar hyperplasia no atypia or malignancy was noted one sentinel node was negative for metastatic disease or any kind of malignancies. 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  #3 patient declined any radiation therapy.  #4 patient was then begun on every 3 week Herceptin with letrozole 2.5 mg daily   CURRENT THERAPY: Patient is seen today for Herceptin every 3 weeks. She is also continuing letrozole 2.5 mg daily.  INTERVAL HISTORY: [name] 51 y.o. female returns for followup visit today. Overall she is doing well.she has been tolerating the letrozole reasonably well she does have some hot flashes. She otherwise denies any nausea vomiting fevers chills night sweats. She looks absolutely remarkable. She is accompanied by her husband. She continues to work full-time. She has no chest pains no palpitations no swelling on her arms or feet. No rashes. No myalgias or arthralgias. Remainder of the 10 point review of systems is negative. MEDICAL HISTORY: Past Medical History  Diagnosis Date  . Thyroid disease   . Breast cancer   . GERD  (gastroesophageal reflux disease)   . Wears glasses   . Crohn's   . History of small bowel obstruction   . Depression   . Anemia 02/03/2011  . Depression 02/25/2011    ALLERGIES:  is allergic to amoxicillin-pot clavulanate and doxycycline.  MEDICATIONS:  Current Outpatient Prescriptions  Medication Sig Dispense Refill  . acetaminophen (TYLENOL) 500 MG tablet Take 500 mg by mouth every 6 (six) hours as needed.        Marland Kitchen amphetamine-dextroamphetamine (ADDERALL XR) 20 MG 24 hr capsule Ad lib.      Marland Kitchen aspirin-acetaminophen-caffeine (EXCEDRIN MIGRAINE) 250-250-65 MG per tablet Take 1 tablet by mouth every 6 (six) hours as needed.        . carvedilol (COREG) 3.125 MG tablet Take 1 tablet (3.125 mg total) by mouth 2 (two) times daily with a meal.  60 tablet  6  . dexlansoprazole (DEXILANT) 60 MG capsule Take 60 mg by mouth as needed.      . Iron-DSS-B12-FA-C-E-Cu-Biotin (TL-HEM 150) 150-1 MG TABS       . levothyroxine (SYNTHROID, LEVOTHROID) 200 MCG tablet Take 175 mcg by mouth 1 day or 1 dose.       . lidocaine-prilocaine (EMLA) cream Apply topically as needed.  30 g  0  . promethazine (PHENERGAN) 25 MG tablet Take 1 tablet (25 mg total) by mouth every 6 (six) hours as needed.  30 tablet  2  . traMADol (ULTRAM) 50 MG tablet Take 50 mg by mouth every 6 (six) hours as needed.      . valACYclovir (VALTREX) 1000 MG tablet       . venlafaxine XR (EFFEXOR-XR) 75 MG 24 hr capsule Take  225 mg by mouth daily.      Marland Kitchen DISCONTD: venlafaxine (EFFEXOR-XR) 75 MG 24 hr capsule Take 2 capsules (150 mg total) by mouth daily.  60 capsule  3  . letrozole (FEMARA) 2.5 MG tablet       . DISCONTD: omeprazole (PRILOSEC) 20 MG capsule Take 20 mg by mouth daily.       No current facility-administered medications for this visit.   Facility-Administered Medications Ordered in Other Visits  Medication Dose Route Frequency Provider Last Rate Last Dose  . 0.9 %  sodium chloride infusion   Intravenous Once Victorino December,  MD      . diphenhydrAMINE (BENADRYL) injection 25 mg  25 mg Intravenous Once Victorino December, MD        SURGICAL HISTORY:  Past Surgical History  Procedure Date  . Colon resection 2000, 2001    with colostomy  . Mastectomy 2012    bilateral    REVIEW OF SYSTEMS:  A comprehensive review of systems was negative.   PHYSICAL EXAMINATION: General appearance: alert, cooperative and appears stated age Head: Normocephalic, without obvious abnormality, atraumatic Neck: no adenopathy, no carotid bruit, no JVD, supple, symmetrical, trachea midline and thyroid not enlarged, symmetric, no tenderness/mass/nodules Lymph nodes: Cervical, supraclavicular, and axillary nodes normal. Resp: clear to auscultation bilaterally and normal percussion bilaterally Back: symmetric, no curvature. ROM normal. No CVA tenderness. Cardio: regular rate and rhythm, S1, S2 normal, no murmur, click, rub or gallop GI: soft, non-tender; bowel sounds normal; no masses,  no organomegaly Extremities: extremities normal, atraumatic, no cyanosis or edema Neurologic: Alert and oriented X 3, normal strength and tone. Normal symmetric reflexes. Normal coordination and gait Bilateral reconstructed breasts are examined there is no evidence of any kind and nodularity on the skin.  ECOG PERFORMANCE STATUS: 0 - Asymptomatic  Blood pressure 115/66, pulse 85, temperature 97.9 F (36.6 C), temperature source Oral, height 5\' 6"  (1.676 m), weight 164 lb 3.2 oz (74.481 kg).  LABORATORY DATA: Lab Results  Component Value Date   WBC 8.6 06/30/2011   HGB 11.4* 06/30/2011   HCT 35.9 06/30/2011   MCV 87.6 06/30/2011   PLT 302 06/30/2011      Chemistry      Component Value Date/Time   NA 141 02/24/2011 0928   K 4.0 02/24/2011 0928   CL 109 02/24/2011 0928   CO2 23 02/24/2011 0928   BUN 17 02/24/2011 0928   CREATININE 0.89 02/24/2011 0928      Component Value Date/Time   CALCIUM 8.0* 02/24/2011 0928   ALKPHOS 61 02/24/2011 0928   AST 15  02/24/2011 0928   ALT 12 02/24/2011 0928   BILITOT 0.2* 02/24/2011 0928       RADIOGRAPHIC STUDIES:  No results found.  ASSESSMENT: 51 year old female with:  1.  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   2. She is now continuing adjuvant Herceptin every 3 weeks. Her last dose will be given on 07/21/2011.   PLAN: #1 patient will proceed with her scheduled Herceptin today. She is also continuing adjuvant antiestrogen therapy with letrozole overall she is tolerating this quite well.  #2 patient will return in 3 weeks' time in followup and final cycle of Herceptin.  All questions were answered. The patient knows to call the clinic with any problems, questions or concerns. We can certainly see the patient much sooner if necessary.  I spent 30 minutes counseling the patient face to  face. The total time spent in the appointment was 30 minutes.    Drue Second, MD Medical/Oncology [facility] [phone] (beeper) [phone] (Office)  06/30/2011, 10:35 AM

## 2011-06-30 NOTE — Patient Instructions (Signed)
[facility] Discharge Instructions for Patients Receiving Chemotherapy  Today you received the following biotherapy agent: Herceptin.     BELOW ARE SYMPTOMS THAT SHOULD BE REPORTED IMMEDIATELY:  *FEVER GREATER THAN 100.5 F  *CHILLS WITH OR WITHOUT FEVER  NAUSEA AND VOMITING THAT IS NOT CONTROLLED WITH YOUR NAUSEA MEDICATION  *UNUSUAL SHORTNESS OF BREATH  *UNUSUAL BRUISING OR BLEEDING  TENDERNESS IN MOUTH AND THROAT WITH OR WITHOUT PRESENCE OF ULCERS  *URINARY PROBLEMS  *BOWEL PROBLEMS  UNUSUAL RASH Items with * indicate a potential emergency and should be followed up as soon as possible.   Feel free to call the clinic you have any questions or concerns. The clinic phone number is [phone].   I have been informed and understand all the instructions given to me. I know to contact the clinic, my physician, or go to the Emergency Department if any problems should occur. I do not have any questions at this time, but understand that I may call the clinic during office hours   should I have any questions or need assistance in obtaining follow up care.    __________________________________________  _____________  __________ Signature of Patient or Authorized Representative            Date                   Time    __________________________________________ Nurse's Signature

## 2011-06-30 NOTE — Patient Instructions (Signed)
1. Proceed with Herceptin  2. MRI of brain  3. Prescription for cymbalta to your pharmacy

## 2011-07-04 ENCOUNTER — Ambulatory Visit
Admission: RE | Admit: 2011-07-04 | Discharge: 2011-07-04 | Disposition: A | Discharge status: Home / Self Care | Payer: Managed Care, Other (non HMO) | Source: Ambulatory Visit | Attending: Oncology | Admitting: Oncology

## 2011-07-04 DIAGNOSIS — C50919 Malignant neoplasm of unspecified site of unspecified female breast: Secondary | ICD-10-CM

## 2011-07-04 MED ORDER — GADOBENATE DIMEGLUMINE 529 MG/ML IV SOLN
15.0000 mL | Freq: Once | INTRAVENOUS | Status: AC | PRN
  Administered 2011-07-04: 15 mL via INTRAVENOUS

## 2011-07-05 ENCOUNTER — Telehealth: Payer: Self-pay | Admitting: *Deleted

## 2011-07-05 NOTE — Telephone Encounter (Signed)
Message copied by Cooper Render on Tue Jul 05, 2011  3:23 PM ------      Message from: [name]      Created: Mon Jul 04, 2011 11:04 PM       Call patient:MRI ok

## 2011-07-05 NOTE — Telephone Encounter (Signed)
Per MD, notified pt MRO-ok. Pt verbalized understanding. Denied needing further assistance at this time.

## 2011-07-19 ENCOUNTER — Other Ambulatory Visit: Payer: Self-pay | Admitting: Gastroenterology

## 2011-07-21 ENCOUNTER — Ambulatory Visit (HOSPITAL_BASED_OUTPATIENT_CLINIC_OR_DEPARTMENT_OTHER): Payer: Managed Care, Other (non HMO) | Admitting: Oncology

## 2011-07-21 ENCOUNTER — Ambulatory Visit (HOSPITAL_BASED_OUTPATIENT_CLINIC_OR_DEPARTMENT_OTHER): Payer: Managed Care, Other (non HMO)

## 2011-07-21 ENCOUNTER — Other Ambulatory Visit (HOSPITAL_BASED_OUTPATIENT_CLINIC_OR_DEPARTMENT_OTHER): Payer: Managed Care, Other (non HMO) | Admitting: Lab

## 2011-07-21 ENCOUNTER — Telehealth: Payer: Self-pay | Admitting: Oncology

## 2011-07-21 ENCOUNTER — Encounter: Payer: Self-pay | Admitting: Oncology

## 2011-07-21 VITALS — BP 131/80 | HR 86 | Temp 98.0°F | Ht 66.0 in | Wt 165.0 lb

## 2011-07-21 DIAGNOSIS — D649 Anemia, unspecified: Secondary | ICD-10-CM

## 2011-07-21 DIAGNOSIS — Z170 Estrogen receptor positive status [ER+]: Secondary | ICD-10-CM

## 2011-07-21 DIAGNOSIS — C50919 Malignant neoplasm of unspecified site of unspecified female breast: Secondary | ICD-10-CM

## 2011-07-21 DIAGNOSIS — C50419 Malignant neoplasm of upper-outer quadrant of unspecified female breast: Secondary | ICD-10-CM

## 2011-07-21 DIAGNOSIS — F329 Major depressive disorder, single episode, unspecified: Secondary | ICD-10-CM

## 2011-07-21 DIAGNOSIS — Z5112 Encounter for antineoplastic immunotherapy: Secondary | ICD-10-CM

## 2011-07-21 LAB — CBC WITH DIFFERENTIAL/PLATELET
EOS%: 4.2 % (ref 0.0–7.0)
Eosinophils Absolute: 0.3 10*3/uL (ref 0.0–0.5)
LYMPH%: 30.4 % (ref 14.0–49.7)
MCH: 28.1 pg (ref 25.1–34.0)
MCHC: 31.5 g/dL (ref 31.5–36.0)
MCV: 89.1 fL (ref 79.5–101.0)
MONO%: 9.3 % (ref 0.0–14.0)
Platelets: 247 10*3/uL (ref 145–400)
RBC: 3.95 10*6/uL (ref 3.70–5.45)
RDW: 14.8 % — ABNORMAL HIGH (ref 11.2–14.5)
nRBC: 0 % (ref 0–0)

## 2011-07-21 MED ORDER — DIPHENHYDRAMINE HCL 25 MG PO CAPS
50.0000 mg | ORAL_CAPSULE | Freq: Once | ORAL | Status: AC
  Administered 2011-07-21: 50 mg via ORAL

## 2011-07-21 MED ORDER — ACETAMINOPHEN 325 MG PO TABS
650.0000 mg | ORAL_TABLET | Freq: Once | ORAL | Status: AC
  Administered 2011-07-21: 650 mg via ORAL

## 2011-07-21 MED ORDER — SODIUM CHLORIDE 0.9 % IJ SOLN
10.0000 mL | INTRAMUSCULAR | Status: DC | PRN
  Administered 2011-07-21: 10 mL
  Filled 2011-07-21: qty 10

## 2011-07-21 MED ORDER — HEPARIN SOD (PORK) LOCK FLUSH 100 UNIT/ML IV SOLN
500.0000 [IU] | Freq: Once | INTRAVENOUS | Status: AC | PRN
  Administered 2011-07-21: 500 [IU]
  Filled 2011-07-21: qty 5

## 2011-07-21 MED ORDER — SODIUM CHLORIDE 0.9 % IV SOLN
Freq: Once | INTRAVENOUS | Status: AC
  Administered 2011-07-21: 10:00:00 via INTRAVENOUS

## 2011-07-21 MED ORDER — TRASTUZUMAB CHEMO INJECTION 440 MG
6.0000 mg/kg | Freq: Once | INTRAVENOUS | Status: AC
  Administered 2011-07-21: 420 mg via INTRAVENOUS
  Filled 2011-07-21: qty 20

## 2011-07-21 NOTE — Progress Notes (Signed)
OFFICE PROGRESS NOTE    Leah Gemma, PA, PA-C [address] [place] [address] Chevis Pretty, MD Lonie Peak, MD Dr. 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  PRIOR THERAPY:  #1 patient was originally diagnosed in December 2011. She received neoadjuvant chemotherapy in the originally consisting of Taxotere carboplatinum and Herceptin from from December 2011 to March 2012.  #2 patient then underwent bilateral mastectomies on 07/27/2010 with the final pathology of the left breast showing benign breast tissue with fibrocystic changes and columnar hyperplasia no atypia or malignancy was noted one sentinel node was negative for metastatic disease or any kind of malignancies. 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  #3 patient declined any radiation therapy.  #4 patient has completed a year of herceptin 07/21/11   #5. Letrozole 2.5 mg daily a total of 5 years of therapy planned  CURRENT THERAPY: Patient is seen today for final Herceptin. She is also continuing letrozole 2.5 mg daily.  INTERVAL HISTORY: [name] 51 y.o. female returns for followup visit today. Overall she is doing well.she has been tolerating the letrozole reasonably well she does have significant hot flashes. She otherwise denies any nausea vomiting fevers chills night sweats. She looks absolutely remarkable. She is accompanied by her husband. She continues to work full-time. She has no chest pains no palpitations no swelling on her arms or feet. No rashes. She has some myalgias or arthralgias. Remainder of the 10 point review of systems is negative. MEDICAL HISTORY: Past Medical History  Diagnosis Date  .  Thyroid disease   . Breast cancer   . GERD (gastroesophageal reflux disease)   . Wears glasses   . Crohn's   . History of small bowel obstruction   . Depression   . Anemia 02/03/2011  . Depression 02/25/2011    ALLERGIES:  is allergic to amoxicillin-pot clavulanate and doxycycline.  MEDICATIONS:  Current Outpatient Prescriptions  Medication Sig Dispense Refill  . acetaminophen (TYLENOL) 500 MG tablet Take 500 mg by mouth every 6 (six) hours as needed.        Marland Kitchen amphetamine-dextroamphetamine (ADDERALL XR) 20 MG 24 hr capsule Ad lib.      Marland Kitchen aspirin-acetaminophen-caffeine (EXCEDRIN MIGRAINE) 250-250-65 MG per tablet Take 1 tablet by mouth every 6 (six) hours as needed.        . carvedilol (COREG) 3.125 MG tablet Take 1 tablet (3.125 mg total) by mouth 2 (two) times daily with a meal.  60 tablet  6  . dexlansoprazole (DEXILANT) 60 MG capsule Take 60 mg by mouth as needed.      . DULoxetine (CYMBALTA) 20 MG capsule Take 1 capsule (20 mg total) by mouth daily.  30 capsule  11  . Iron-DSS-B12-FA-C-E-Cu-Biotin (TL-HEM 150) 150-1 MG TABS       . letrozole (FEMARA) 2.5 MG tablet       . levothyroxine (SYNTHROID, LEVOTHROID) 200 MCG tablet Take 175 mcg by mouth 1 day or 1 dose.       . lidocaine-prilocaine (EMLA) cream Apply topically as needed.  30 g  0  . promethazine (PHENERGAN) 25 MG tablet Take 1 tablet (25 mg total) by mouth every 6 (six) hours as needed.  30 tablet  2  . traMADol (ULTRAM)  50 MG tablet Take 50 mg by mouth every 6 (six) hours as needed.      . valACYclovir (VALTREX) 1000 MG tablet       . venlafaxine XR (EFFEXOR-XR) 75 MG 24 hr capsule Take 225 mg by mouth daily.      Marland Kitchen DISCONTD: omeprazole (PRILOSEC) 20 MG capsule Take 20 mg by mouth daily.       No current facility-administered medications for this visit.   Facility-Administered Medications Ordered in Other Visits  Medication Dose Route Frequency Provider Last Rate Last Dose  . 0.9 %  sodium chloride infusion    Intravenous Once Victorino December, MD      . diphenhydrAMINE (BENADRYL) injection 25 mg  25 mg Intravenous Once Victorino December, MD        SURGICAL HISTORY:  Past Surgical History  Procedure Date  . Colon resection 2000, 2001    with colostomy  . Mastectomy 2012    bilateral    REVIEW OF SYSTEMS:  A comprehensive review of systems was negative.   PHYSICAL EXAMINATION: General appearance: alert, cooperative and appears stated age Head: Normocephalic, without obvious abnormality, atraumatic Neck: no adenopathy, no carotid bruit, no JVD, supple, symmetrical, trachea midline and thyroid not enlarged, symmetric, no tenderness/mass/nodules Lymph nodes: Cervical, supraclavicular, and axillary nodes normal. Resp: clear to auscultation bilaterally and normal percussion bilaterally Back: symmetric, no curvature. ROM normal. No CVA tenderness. Cardio: regular rate and rhythm, S1, S2 normal, no murmur, click, rub or gallop GI: soft, non-tender; bowel sounds normal; no masses,  no organomegaly Extremities: extremities normal, atraumatic, no cyanosis or edema Neurologic: Alert and oriented X 3, normal strength and tone. Normal symmetric reflexes. Normal coordination and gait Bilateral reconstructed breasts are examined there is no evidence of any kind and nodularity on the skin.  ECOG PERFORMANCE STATUS: 0 - Asymptomatic  Blood pressure 131/80, pulse 86, temperature 98 F (36.7 C), temperature source Oral, height 5\' 6"  (1.676 m), weight 165 lb (74.844 kg).  LABORATORY DATA: Lab Results  Component Value Date   WBC 7.9 07/21/2011   HGB 11.1* 07/21/2011   HCT 35.2 07/21/2011   MCV 89.1 07/21/2011   PLT 247 07/21/2011      Chemistry      Component Value Date/Time   NA 141 02/24/2011 0928   K 4.0 02/24/2011 0928   CL 109 02/24/2011 0928   CO2 23 02/24/2011 0928   BUN 17 02/24/2011 0928   CREATININE 0.89 02/24/2011 0928      Component Value Date/Time   CALCIUM 8.0* 02/24/2011 0928   ALKPHOS 61 02/24/2011  0928   AST 15 02/24/2011 0928   ALT 12 02/24/2011 0928   BILITOT 0.2* 02/24/2011 0928       RADIOGRAPHIC STUDIES:  No results found.  ASSESSMENT: 51 year old female with:  1.  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   2. She is now continuing adjuvant Herceptin every 3 weeks. Her last dose will be given on 07/21/2011.   PLAN: #1 patient will proceed with her scheduled Herceptin today. She is also continuing adjuvant antiestrogen therapy with letrozole overall she is tolerating this quite well.  #2 I will see the patient every 3 months. We discussed that she should call with any problems.  All questions were answered. The patient knows to call the clinic with any problems, questions or concerns. We can certainly see the patient much sooner if necessary.  I spent 30  minutes counseling the patient face to face. The total time spent in the appointment was 30 minutes.    Drue Second, MD Medical/Oncology [facility] [phone] (beeper) [phone] (Office)  07/21/2011, 9:27 AM

## 2011-07-21 NOTE — Telephone Encounter (Signed)
gve the pt her sept 2013 appt calendar °

## 2011-07-21 NOTE — Patient Instructions (Signed)
1. Congratulations you have finished your Herceptin  2. Continue Letrozole daily  3. I will see you back in 3 months

## 2011-07-21 NOTE — Patient Instructions (Signed)
[facility] Discharge Instructions for Patients Receiving Chemotherapy  Today you received the following chemotherapy agents Herceptin.      BELOW ARE SYMPTOMS THAT SHOULD BE REPORTED IMMEDIATELY:  *FEVER GREATER THAN 100.5 F  *CHILLS WITH OR WITHOUT FEVER  NAUSEA AND VOMITING THAT IS NOT CONTROLLED WITH YOUR NAUSEA MEDICATION  *UNUSUAL SHORTNESS OF BREATH  *UNUSUAL BRUISING OR BLEEDING  TENDERNESS IN MOUTH AND THROAT WITH OR WITHOUT PRESENCE OF ULCERS  *URINARY PROBLEMS  *BOWEL PROBLEMS  UNUSUAL RASH Items with * indicate a potential emergency and should be followed up as soon as possible.  Feel free to call the clinic you have any questions or concerns. The clinic phone number is [phone].    

## 2011-07-28 ENCOUNTER — Ambulatory Visit (HOSPITAL_COMMUNITY)
Admission: RE | Admit: 2011-07-28 | Discharge: 2011-07-28 | Disposition: A | Discharge status: Home / Self Care | Payer: Managed Care, Other (non HMO) | Source: Ambulatory Visit | Attending: Physician Assistant | Admitting: Physician Assistant

## 2011-07-28 ENCOUNTER — Ambulatory Visit (HOSPITAL_COMMUNITY)
Admission: RE | Admit: 2011-07-28 | Discharge: 2011-07-28 | Disposition: A | Discharge status: Home / Self Care | Payer: Managed Care, Other (non HMO) | Source: Ambulatory Visit | Attending: Internal Medicine | Admitting: Internal Medicine

## 2011-07-28 ENCOUNTER — Encounter (HOSPITAL_COMMUNITY): Payer: Self-pay

## 2011-07-28 VITALS — BP 125/70 | HR 77 | Ht 66.0 in | Wt 163.8 lb

## 2011-07-28 DIAGNOSIS — I509 Heart failure, unspecified: Secondary | ICD-10-CM | POA: Insufficient documentation

## 2011-07-28 DIAGNOSIS — C50919 Malignant neoplasm of unspecified site of unspecified female breast: Secondary | ICD-10-CM

## 2011-07-28 DIAGNOSIS — K5090 Crohn's disease, unspecified, without complications: Secondary | ICD-10-CM | POA: Insufficient documentation

## 2011-07-28 DIAGNOSIS — I059 Rheumatic mitral valve disease, unspecified: Secondary | ICD-10-CM | POA: Insufficient documentation

## 2011-07-28 NOTE — Patient Instructions (Addendum)
Follow up in 3 months with an ECHO 

## 2011-07-28 NOTE — Assessment & Plan Note (Addendum)
She has completed Herceptin. Reviewed ECHO results. EF and lateral S' prime remains stable.   Follow up in 3 months with an ECHO . If ECHO remains stable will stop carvedilol.   Patient seen and examined with Tonye Becket, NP. We discussed all aspects of the encounter. I agree with the assessment and plan as stated above. Echo reviewed in clinic personally. EF and lateral s' look good. Will continue carvedilol at current dose and check echo in 3 months. If everything stable will d/c carvedilol.

## 2011-07-28 NOTE — Progress Notes (Signed)
Patient ID: [name], female   DOB: 1960/10/18, 51 y.o.   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  Diagnosed with R breast CA in November 2011. Treated with neoadjuvant chemo (TCH regimen). Underwent bilateral mastectomies June 12,2012 c/b delayed wound healing left breat. Pathology high-grade ductal CA of R breast 0/6 lymph nodes negative at surgery (but pre-op lymph node bx +) Triple receptor positive.  Pre-chemo echo read as EF 55% in 12/11.  Then treated with one or 2 doses Herceptin but this was stopped after echo 08/24/10: EF 50%. However, had f/u MUGA with EF 59%.  Echo 12/11 EF 55-60% s' 13. Echo 7/12 EF 55-60% s'16 Echo 01/18/11 EF 50-55% s'16 cm/s Echo 05/03/11 EF 45-50% lateral s' 10 cm/s MUGA 05/19/11 EF 64%  ECHO 07/28/11 EF 50-55%  Lateral s' 11.3   She is here for follow up today.Last visit carvedilol started 3.125 mg twice a day.  Complains of joint pain associated with femara. Completed Herceptin. Denies SOB/PND/Orthopnea.   ROS: All other systems normal except as mentioned in HPI, past medical history and problem list.    Past Medical History  Diagnosis Date  . Thyroid disease   . Breast cancer   . GERD (gastroesophageal reflux disease)   . Wears glasses   . Crohn's   . History of small bowel obstruction   . Depression   . Anemia 02/03/2011  . Depression 02/25/2011    Current Outpatient Prescriptions  Medication Sig Dispense Refill  . acetaminophen (TYLENOL) 500 MG tablet Take 500 mg by mouth every 6 (six) hours as needed.        Marland Kitchen amphetamine-dextroamphetamine (ADDERALL XR) 20 MG 24 hr capsule Ad lib.      Marland Kitchen aspirin-acetaminophen-caffeine (EXCEDRIN MIGRAINE) 250-250-65 MG per tablet Take 1 tablet by mouth every 6 (six) hours as needed.        . carvedilol (COREG) 3.125 MG tablet Take 1 tablet  (3.125 mg total) by mouth 2 (two) times daily with a meal.  60 tablet  6  . dexlansoprazole (DEXILANT) 60 MG capsule Take 60 mg by mouth as needed.      . DULoxetine (CYMBALTA) 20 MG capsule Take 1 capsule (20 mg total) by mouth daily.  30 capsule  11  . Iron-DSS-B12-FA-C-E-Cu-Biotin (TL-HEM 150) 150-1 MG TABS       . letrozole (FEMARA) 2.5 MG tablet       . levothyroxine (SYNTHROID, LEVOTHROID) 200 MCG tablet Take 175 mcg by mouth 1 day or 1 dose.       . lidocaine-prilocaine (EMLA) cream Apply topically as needed.  30 g  0  . promethazine (PHENERGAN) 25 MG tablet Take 1 tablet (25 mg total) by mouth every 6 (six) hours as needed.  30 tablet  2  . traMADol (ULTRAM) 50 MG tablet Take 50 mg by mouth every 6 (six) hours as needed.      . valACYclovir (VALTREX) 1000 MG tablet       . venlafaxine XR (EFFEXOR-XR) 75 MG 24 hr capsule Take 225 mg by mouth daily.      Marland Kitchen DISCONTD: omeprazole (PRILOSEC) 20 MG capsule Take 20 mg by mouth daily.       No current facility-administered medications for this encounter.   Facility-Administered Medications Ordered in Other Encounters  Medication Dose Route Frequency Provider Last  Rate Last Dose  . 0.9 %  sodium chloride infusion   Intravenous Once Victorino December, MD      . diphenhydrAMINE (BENADRYL) injection 25 mg  25 mg Intravenous Once Victorino December, MD         Allergies  Allergen Reactions  . Amoxicillin-Pot Clavulanate   . Doxycycline     PHYSICAL EXAM: Filed Vitals:   07/28/11 0947  BP: 125/70  Pulse: 77  Height: 5\' 6"  (1.676 m)  Weight: 163 lb 12.8 oz (74.299 kg)    General:  Well appearing. No respiratory difficulty HEENT: normal Neck: supple. no JVD. Carotids 2+ bilat; no bruits. No lymphadenopathy or thryomegaly appreciated. Cor: PMI nondisplaced. Regular rate & rhythm. No rubs, gallops or murmurs. Lungs: clear Abdomen: soft, nontender, nondistended. No hepatosplenomegaly. No bruits or masses. Good bowel sounds. Extremities: no  cyanosis, clubbing, rash, edema Neuro: alert & oriented x 3, cranial nerves grossly intact. moves all 4 extremities w/o difficulty. Affect pleasant.  ASSESSMENT & PLAN:

## 2011-07-28 NOTE — Progress Notes (Signed)
*  PRELIMINARY RESULTS* Echocardiogram 2D Echocardiogram has been performed.  [name] 07/28/2011, 9:29 AM

## 2011-08-22 ENCOUNTER — Other Ambulatory Visit: Payer: Self-pay | Admitting: Oncology

## 2011-10-31 ENCOUNTER — Telehealth: Payer: Self-pay | Admitting: *Deleted

## 2011-10-31 ENCOUNTER — Ambulatory Visit (HOSPITAL_BASED_OUTPATIENT_CLINIC_OR_DEPARTMENT_OTHER): Payer: Managed Care, Other (non HMO) | Admitting: Oncology

## 2011-10-31 ENCOUNTER — Other Ambulatory Visit (HOSPITAL_BASED_OUTPATIENT_CLINIC_OR_DEPARTMENT_OTHER): Payer: Managed Care, Other (non HMO) | Admitting: Lab

## 2011-10-31 VITALS — BP 112/78 | HR 75 | Temp 98.0°F | Resp 20 | Ht 66.0 in | Wt 172.3 lb

## 2011-10-31 DIAGNOSIS — Z170 Estrogen receptor positive status [ER+]: Secondary | ICD-10-CM

## 2011-10-31 DIAGNOSIS — C50919 Malignant neoplasm of unspecified site of unspecified female breast: Secondary | ICD-10-CM

## 2011-10-31 DIAGNOSIS — C50419 Malignant neoplasm of upper-outer quadrant of unspecified female breast: Secondary | ICD-10-CM

## 2011-10-31 DIAGNOSIS — K5090 Crohn's disease, unspecified, without complications: Secondary | ICD-10-CM

## 2011-10-31 LAB — CBC WITH DIFFERENTIAL/PLATELET
BASO%: 0.6 % (ref 0.0–2.0)
Eosinophils Absolute: 0.2 10*3/uL (ref 0.0–0.5)
HCT: 33.5 % — ABNORMAL LOW (ref 34.8–46.6)
HGB: 11.1 g/dL — ABNORMAL LOW (ref 11.6–15.9)
LYMPH%: 18.4 % (ref 14.0–49.7)
MCHC: 33.2 g/dL (ref 31.5–36.0)
MONO#: 0.5 10*3/uL (ref 0.1–0.9)
NEUT#: 5.6 10*3/uL (ref 1.5–6.5)
NEUT%: 71.9 % (ref 38.4–76.8)
Platelets: 198 10*3/uL (ref 145–400)
WBC: 7.8 10*3/uL (ref 3.9–10.3)
lymph#: 1.4 10*3/uL (ref 0.9–3.3)

## 2011-10-31 LAB — COMPREHENSIVE METABOLIC PANEL (CC13)
ALT: 28 U/L (ref 0–55)
Albumin: 3.4 g/dL — ABNORMAL LOW (ref 3.5–5.0)
CO2: 19 mEq/L — ABNORMAL LOW (ref 22–29)
Calcium: 9.2 mg/dL (ref 8.4–10.4)
Chloride: 111 mEq/L — ABNORMAL HIGH (ref 98–107)
Potassium: 3.8 mEq/L (ref 3.5–5.1)
Sodium: 143 mEq/L (ref 136–145)
Total Protein: 6.1 g/dL — ABNORMAL LOW (ref 6.4–8.3)

## 2011-10-31 MED ORDER — LETROZOLE 2.5 MG PO TABS: 2.5000 mg | ORAL_TABLET | Freq: Every day | ORAL | Status: DC

## 2011-10-31 NOTE — Telephone Encounter (Signed)
Gave patient appointment for 04-23-2012 starting at 9:30am

## 2011-10-31 NOTE — Patient Instructions (Addendum)
Tolerating letrozole well, refills sent to pharmacy  I will see you back in 6 months

## 2011-11-03 ENCOUNTER — Telehealth (HOSPITAL_COMMUNITY): Payer: Self-pay | Admitting: Cardiology

## 2011-11-03 NOTE — Telephone Encounter (Signed)
LEFT MESSAGE FOR PT TO RETURN CALL.  NEED TO SCHEDULE FOLLOW UP.

## 2011-11-03 NOTE — Telephone Encounter (Signed)
Left message for pt to return call. Need to schedule follow up (sept.2013) with echo

## 2011-11-07 NOTE — Progress Notes (Signed)
OFFICE PROGRESS NOTE    Leah Gemma, PA [address] [place] [address] Chevis Pretty, MD Lonie Peak, MD Dr. 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  PRIOR THERAPY:  #1 patient was originally diagnosed in December 2011. She received neoadjuvant chemotherapy in the originally consisting of Taxotere carboplatinum and Herceptin from from December 2011 to March 2012.  #2 patient then underwent bilateral mastectomies on 07/27/2010 with the final pathology of the left breast showing benign breast tissue with fibrocystic changes and columnar hyperplasia no atypia or malignancy was noted one sentinel node was negative for metastatic disease or any kind of malignancies. 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  #3 patient declined any radiation therapy.  #4 patient has completed a year of herceptin 07/21/11   #5. Letrozole 2.5 mg daily a total of 5 years of therapy planned  CURRENT THERAPY: Letrozole 2.5 mg daily  INTERVAL HISTORY: [name] 51 y.o. female returns for followup visit today. Overall she is doing well.she has been tolerating the letrozole reasonably well she does have significant hot flashes. She otherwise denies any nausea vomiting fevers chills night sweats. She looks absolutely remarkable. She is accompanied by her husband. She continues to work full-time. She has no chest pains no palpitations no swelling on her arms or feet. No rashes. She has some myalgias or arthralgias. Remainder of the 10 point review of systems is negative. MEDICAL HISTORY: Past Medical History  Diagnosis Date  . Thyroid disease   . Breast cancer   . GERD (gastroesophageal reflux  disease)   . Wears glasses   . Crohn's   . History of small bowel obstruction   . Depression   . Anemia 02/03/2011  . Depression 02/25/2011    ALLERGIES:  is allergic to amoxicillin-pot clavulanate and doxycycline.  MEDICATIONS:  Current Outpatient Prescriptions  Medication Sig Dispense Refill  . acetaminophen (TYLENOL) 500 MG tablet Take 500 mg by mouth every 6 (six) hours as needed.        Marland Kitchen amphetamine-dextroamphetamine (ADDERALL XR) 20 MG 24 hr capsule Ad lib.      Marland Kitchen aspirin-acetaminophen-caffeine (EXCEDRIN MIGRAINE) 250-250-65 MG per tablet Take 1 tablet by mouth every 6 (six) hours as needed.        Marland Kitchen dexlansoprazole (DEXILANT) 60 MG capsule Take 60 mg by mouth as needed.      . DULoxetine (CYMBALTA) 20 MG capsule Take 90 mg by mouth daily.      . Iron-DSS-B12-FA-C-E-Cu-Biotin (TL-HEM 150) 150-1 MG TABS       . letrozole (FEMARA) 2.5 MG tablet Take 1 tablet (2.5 mg total) by mouth daily.  90 tablet  12  . levothyroxine (SYNTHROID, LEVOTHROID) 200 MCG tablet Take 175 mcg by mouth 1 day or 1 dose.       . lidocaine-prilocaine (EMLA) cream Apply topically as needed.  30 g  0  . promethazine (PHENERGAN) 25 MG tablet TAKE ONE TABLET BY MOUTH EVERY SIX HOURS AS NEEDED  60 tablet  9  . traMADol (ULTRAM) 50 MG tablet Take 50 mg by mouth every 6 (six) hours as needed.      . valACYclovir (VALTREX) 1000 MG tablet       . venlafaxine XR (EFFEXOR-XR) 75 MG 24  hr capsule Take 75 mg by mouth daily.       Marland Kitchen DISCONTD: omeprazole (PRILOSEC) 20 MG capsule Take 20 mg by mouth daily.       No current facility-administered medications for this visit.   Facility-Administered Medications Ordered in Other Visits  Medication Dose Route Frequency Provider Last Rate Last Dose  . 0.9 %  sodium chloride infusion   Intravenous Once Victorino December, MD      . diphenhydrAMINE (BENADRYL) injection 25 mg  25 mg Intravenous Once Victorino December, MD        SURGICAL HISTORY:  Past Surgical History  Procedure  Date  . Colon resection 2000, 2001    with colostomy  . Mastectomy 2012    bilateral    REVIEW OF SYSTEMS:  A comprehensive review of systems was negative.   PHYSICAL EXAMINATION: General appearance: alert, cooperative and appears stated age Head: Normocephalic, without obvious abnormality, atraumatic Neck: no adenopathy, no carotid bruit, no JVD, supple, symmetrical, trachea midline and thyroid not enlarged, symmetric, no tenderness/mass/nodules Lymph nodes: Cervical, supraclavicular, and axillary nodes normal. Resp: clear to auscultation bilaterally and normal percussion bilaterally Back: symmetric, no curvature. ROM normal. No CVA tenderness. Cardio: regular rate and rhythm, S1, S2 normal, no murmur, click, rub or gallop GI: soft, non-tender; bowel sounds normal; no masses,  no organomegaly Extremities: extremities normal, atraumatic, no cyanosis or edema Neurologic: Alert and oriented X 3, normal strength and tone. Normal symmetric reflexes. Normal coordination and gait Bilateral reconstructed breasts are examined there is no evidence of any kind and nodularity on the skin.  ECOG PERFORMANCE STATUS: 0 - Asymptomatic  Blood pressure 112/78, pulse 75, temperature 98 F (36.7 C), temperature source Oral, resp. rate 20, height 5\' 6"  (1.676 m), weight 172 lb 4.8 oz (78.155 kg).  LABORATORY DATA: Lab Results  Component Value Date   WBC 7.8 10/31/2011   HGB 11.1* 10/31/2011   HCT 33.5* 10/31/2011   MCV 93.4 10/31/2011   PLT 198 10/31/2011      Chemistry      Component Value Date/Time   NA 143 10/31/2011 1034   NA 141 02/24/2011 0928   K 3.8 10/31/2011 1034   K 4.0 02/24/2011 0928   CL 111* 10/31/2011 1034   CL 109 02/24/2011 0928   CO2 19* 10/31/2011 1034   CO2 23 02/24/2011 0928   BUN 19.0 10/31/2011 1034   BUN 17 02/24/2011 0928   CREATININE 1.0 10/31/2011 1034   CREATININE 0.89 02/24/2011 0928      Component Value Date/Time   CALCIUM 9.2 10/31/2011 1034   CALCIUM 8.0* 02/24/2011  0928   ALKPHOS 107 10/31/2011 1034   ALKPHOS 61 02/24/2011 0928   AST 29 10/31/2011 1034   AST 15 02/24/2011 0928   ALT 28 10/31/2011 1034   ALT 12 02/24/2011 0928   BILITOT 0.20 10/31/2011 1034   BILITOT 0.2* 02/24/2011 0928       RADIOGRAPHIC STUDIES:  No results found.  ASSESSMENT: 51 year old female with:  1.  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 After completion of neoadjuvant chemotherapy and having had a mastectomy patient went on to receive one full year of Herceptin which she completed in June 2013. She was also begun on letrozole 2.5 mg on a daily basis starting after her surgery. Thus far she has no evidence of recurrent disease.  #2 patient is status post bilateral breast reconstruction.  #3 history of Crohn's  disease.  PLAN: #1 patient is doing well she will continue the letrozole 2.5 mg daily. Pharmacy was sent for refills on the drug.  #2 patient will return in 6 months time. All questions were answered. The patient knows to call the clinic with any problems, questions or concerns. We can certainly see the patient much sooner if necessary.  I spent 30 minutes counseling the patient face to face. The total time spent in the appointment was 30 minutes.    Drue Second, MD Medical/Oncology [facility] [phone] (beeper) [phone] (Office)

## 2011-11-08 ENCOUNTER — Encounter (HOSPITAL_COMMUNITY): Payer: Self-pay | Admitting: Cardiology

## 2011-11-08 NOTE — Telephone Encounter (Signed)
After multiple attempts to contact pt regrding follow up. Letter mailed for pt to contact office for follow up with echo.Marland Kitchen

## 2012-04-23 ENCOUNTER — Other Ambulatory Visit (HOSPITAL_BASED_OUTPATIENT_CLINIC_OR_DEPARTMENT_OTHER): Payer: Managed Care, Other (non HMO) | Admitting: Lab

## 2012-04-23 ENCOUNTER — Encounter: Payer: Self-pay | Admitting: Oncology

## 2012-04-23 ENCOUNTER — Ambulatory Visit (HOSPITAL_BASED_OUTPATIENT_CLINIC_OR_DEPARTMENT_OTHER): Payer: Managed Care, Other (non HMO) | Admitting: Oncology

## 2012-04-23 ENCOUNTER — Telehealth: Payer: Self-pay | Admitting: Oncology

## 2012-04-23 ENCOUNTER — Ambulatory Visit: Payer: Managed Care, Other (non HMO)

## 2012-04-23 VITALS — BP 120/73 | HR 67 | Temp 97.8°F | Resp 20 | Ht 66.0 in | Wt 173.3 lb

## 2012-04-23 DIAGNOSIS — J329 Chronic sinusitis, unspecified: Secondary | ICD-10-CM

## 2012-04-23 DIAGNOSIS — C50919 Malignant neoplasm of unspecified site of unspecified female breast: Secondary | ICD-10-CM

## 2012-04-23 DIAGNOSIS — Z170 Estrogen receptor positive status [ER+]: Secondary | ICD-10-CM

## 2012-04-23 LAB — CBC WITH DIFFERENTIAL/PLATELET
BASO%: 0.5 % (ref 0.0–2.0)
EOS%: 2 % (ref 0.0–7.0)
MCHC: 33.8 g/dL (ref 31.5–36.0)
MONO#: 0.8 10*3/uL (ref 0.1–0.9)
RBC: 4.03 10*6/uL (ref 3.70–5.45)
WBC: 10.8 10*3/uL — ABNORMAL HIGH (ref 3.9–10.3)
lymph#: 1.6 10*3/uL (ref 0.9–3.3)

## 2012-04-23 LAB — COMPREHENSIVE METABOLIC PANEL (CC13)
ALT: 19 U/L (ref 0–55)
AST: 15 U/L (ref 5–34)
CO2: 26 mEq/L (ref 22–29)
Calcium: 9.9 mg/dL (ref 8.4–10.4)
Chloride: 105 mEq/L (ref 98–107)
Sodium: 142 mEq/L (ref 136–145)
Total Bilirubin: 0.23 mg/dL (ref 0.20–1.20)
Total Protein: 7 g/dL (ref 6.4–8.3)

## 2012-04-23 MED ORDER — AZITHROMYCIN 250 MG PO TABS: ORAL_TABLET | ORAL | Status: DC

## 2012-04-23 MED ORDER — SODIUM CHLORIDE 0.9 % IJ SOLN
10.0000 mL | INTRAMUSCULAR | Status: AC | PRN
  Administered 2012-04-23: 10 mL via INTRAVENOUS
  Filled 2012-04-23: qty 10

## 2012-04-23 MED ORDER — HEPARIN SOD (PORK) LOCK FLUSH 100 UNIT/ML IV SOLN
500.0000 [IU] | Freq: Once | INTRAVENOUS | Status: AC
  Administered 2012-04-23: 500 [IU] via INTRAVENOUS
  Filled 2012-04-23: qty 5

## 2012-04-23 NOTE — Progress Notes (Signed)
OFFICE PROGRESS NOTE    [name] [address] [place] [address] Chevis Pretty, MD Lonie Peak, MD Dr. 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  PRIOR THERAPY:  #1 patient was originally diagnosed in December 2011. She received neoadjuvant chemotherapy in the originally consisting of Taxotere carboplatinum and Herceptin from from December 2011 to March 2012.  #2 patient then underwent bilateral mastectomies on 07/27/2010 with the final pathology of the left breast showing benign breast tissue with fibrocystic changes and columnar hyperplasia no atypia or malignancy was noted one sentinel node was negative for metastatic disease or any kind of malignancies. 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  #3 patient declined any radiation therapy.  #4 patient has completed a year of herceptin 07/21/11   #5. Letrozole 2.5 mg daily a total of 5 years of therapy planned  CURRENT THERAPY: Letrozole 2.5 mg daily  INTERVAL HISTORY: [name] 52 y.o. female returns for followup visit today. Overall she is doing well.she has been tolerating the letrozole reasonably well she does have significant hot flashes. She otherwise denies any nausea vomiting fevers chills night sweats. She looks absolutely remarkable. She does have what sounds like sinusitis and pharyngitis. She has no fevers or chills or night sweats. She is having hot flashes do to the letrozole. She is a little fatigued. She continues to work full-time. She has no chest pains no palpitations no swelling on her arms or feet. No rashes. She has some myalgias or arthralgias. Remainder of the 10 point review of systems is  negative.  MEDICAL HISTORY: Past Medical History  Diagnosis Date  . Thyroid disease   . Breast cancer   . GERD (gastroesophageal reflux disease)   . Wears glasses   . Crohn's   . History of small bowel obstruction   . Depression   . Anemia 02/03/2011  . Depression 02/25/2011    ALLERGIES:  is allergic to amoxicillin-pot clavulanate and doxycycline.  MEDICATIONS:  Current Outpatient Prescriptions  Medication Sig Dispense Refill  . letrozole (FEMARA) 2.5 MG tablet Take 1 tablet (2.5 mg total) by mouth daily.  90 tablet  12  . levothyroxine (SYNTHROID, LEVOTHROID) 175 MCG tablet Take 175 mcg by mouth daily.      . valACYclovir (VALTREX) 1000 MG tablet Take 1,000 mg by mouth as needed.      . [DISCONTINUED] omeprazole (PRILOSEC) 20 MG capsule Take 20 mg by mouth daily.       No current facility-administered medications for this visit.   Facility-Administered Medications Ordered in Other Visits  Medication Dose Route Frequency Provider Last Rate Last Dose  . 0.9 %  sodium chloride infusion   Intravenous Once Victorino December, MD      . diphenhydrAMINE (BENADRYL) injection 25 mg  25 mg Intravenous Once Victorino December, MD        SURGICAL HISTORY:  Past Surgical History  Procedure Laterality Date  . Colon resection  2000, 2001    with colostomy  . Mastectomy  2012    bilateral    REVIEW OF SYSTEMS:  A comprehensive review of systems was negative.   PHYSICAL EXAMINATION: General appearance: alert, cooperative and appears stated age Head: Normocephalic, without obvious abnormality, atraumatic Neck:  no adenopathy, no carotid bruit, no JVD, supple, symmetrical, trachea midline and thyroid not enlarged, symmetric, no tenderness/mass/nodules Lymph nodes: Cervical, supraclavicular, and axillary nodes normal. Resp: clear to auscultation bilaterally and normal percussion bilaterally Back: symmetric, no curvature. ROM normal. No CVA tenderness. Cardio: regular rate and rhythm, S1, S2  normal, no murmur, click, rub or gallop GI: soft, non-tender; bowel sounds normal; no masses,  no organomegaly Extremities: extremities normal, atraumatic, no cyanosis or edema Neurologic: Alert and oriented X 3, normal strength and tone. Normal symmetric reflexes. Normal coordination and gait Bilateral reconstructed breasts are examined there is no evidence of any kind and nodularity on the skin.  ECOG PERFORMANCE STATUS: 0 - Asymptomatic  Blood pressure 120/73, pulse 67, temperature 97.8 F (36.6 C), temperature source Oral, resp. rate 20, height 5\' 6"  (1.676 m), weight 173 lb 4.8 oz (78.608 kg).  LABORATORY DATA: Lab Results  Component Value Date   WBC 10.8* 04/23/2012   HGB 12.7 04/23/2012   HCT 37.6 04/23/2012   MCV 93.2 04/23/2012   PLT 222 04/23/2012      Chemistry      Component Value Date/Time   NA 143 10/31/2011 1034   NA 141 02/24/2011 0928   K 3.8 10/31/2011 1034   K 4.0 02/24/2011 0928   CL 111* 10/31/2011 1034   CL 109 02/24/2011 0928   CO2 19* 10/31/2011 1034   CO2 23 02/24/2011 0928   BUN 19.0 10/31/2011 1034   BUN 17 02/24/2011 0928   CREATININE 1.0 10/31/2011 1034   CREATININE 0.89 02/24/2011 0928      Component Value Date/Time   CALCIUM 9.2 10/31/2011 1034   CALCIUM 8.0* 02/24/2011 0928   ALKPHOS 107 10/31/2011 1034   ALKPHOS 61 02/24/2011 0928   AST 29 10/31/2011 1034   AST 15 02/24/2011 0928   ALT 28 10/31/2011 1034   ALT 12 02/24/2011 0928   BILITOT 0.20 10/31/2011 1034   BILITOT 0.2* 02/24/2011 0928       RADIOGRAPHIC STUDIES:  No results found.  ASSESSMENT: 52 year old female with:  1.  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 After completion of neoadjuvant chemotherapy and having had a mastectomy patient went on to receive one full year of Herceptin which she completed in June 2013. She was also begun on letrozole 2.5 mg on a daily basis starting after her surgery. Thus far she has  no evidence of recurrent disease.  #2 patient is status post bilateral breast reconstruction.  #3 history of Crohn's disease.  #4 bronchitis/sinusitis  PLAN: #1 patient is doing well she will continue the letrozole 2.5 mg daily.  #2 patient will possibly tracheobronchitis/sinusitis. I have started her on CPAP empirically. If this does not get better she will try to get in touch with her primary care physician.  #3 she will need port flushes every 2 months and have set these up for her.  #4 patient will return in 6 months time.  All questions were answered. The patient knows to call the clinic with any problems, questions or concerns. We can certainly see the patient much sooner if necessary.  I spent 30 minutes counseling the patient face to face. The total time spent in the appointment was 30 minutes.    Drue Second, MD Medical/Oncology [facility] [phone] (beeper) [phone] (Office)

## 2012-04-23 NOTE — Telephone Encounter (Signed)
gv pt appt schedule for May thru December.

## 2012-04-23 NOTE — Patient Instructions (Addendum)
Continue yourletrozole daily  Begin z-pak today for sinusitis/bronchitis  Port flushes every 2 months  I will see you back in 6 months

## 2012-05-18 IMAGING — CR DG ABDOMEN ACUTE W/ 1V CHEST
4 series · 4 of 4 positions shown · non-contrast
Comparison: 07/12/2008

CLINICAL DATA: Nausea, vomiting, and diarrhea.  History of
colostomy and breast cancer

ACUTE ABDOMEN SERIES (ABDOMEN 2 VIEW & CHEST 1 VIEW)

[w chest pa]
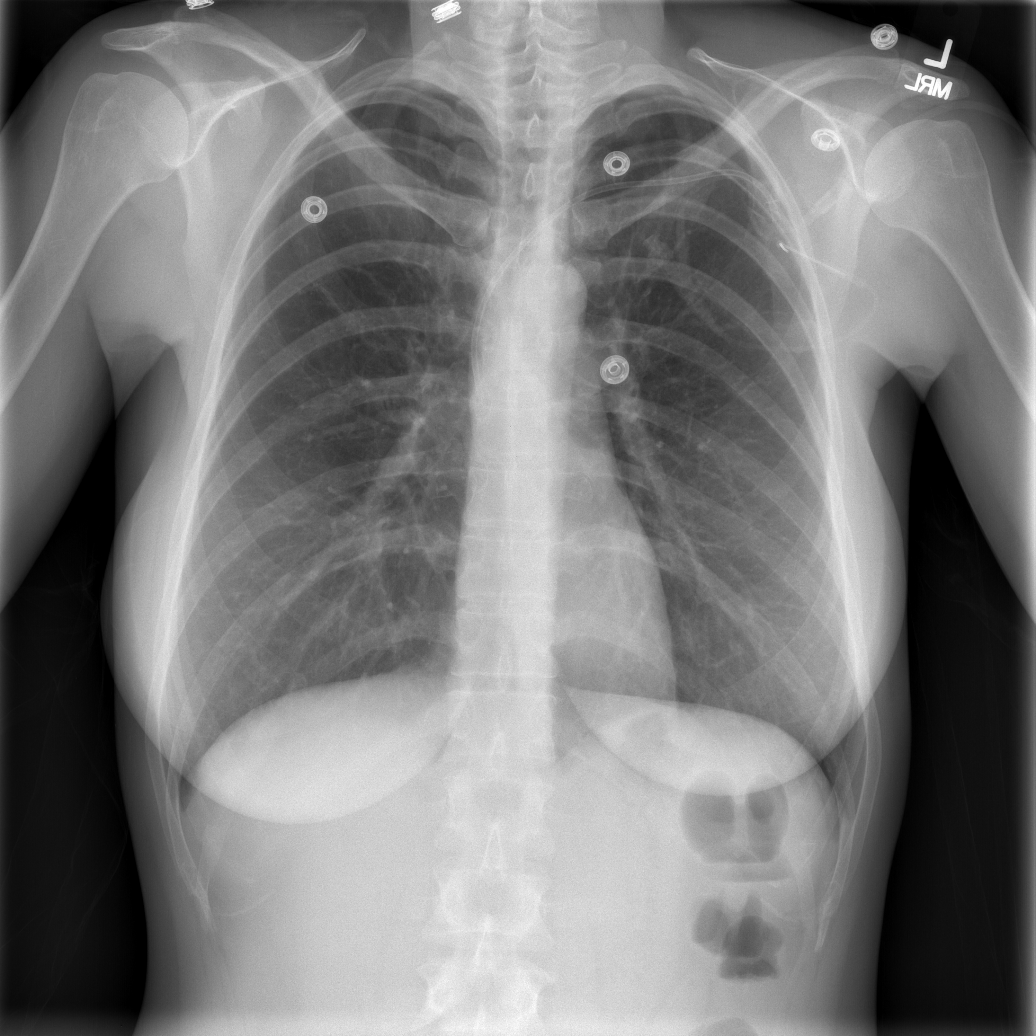

[w abdomen upright *]
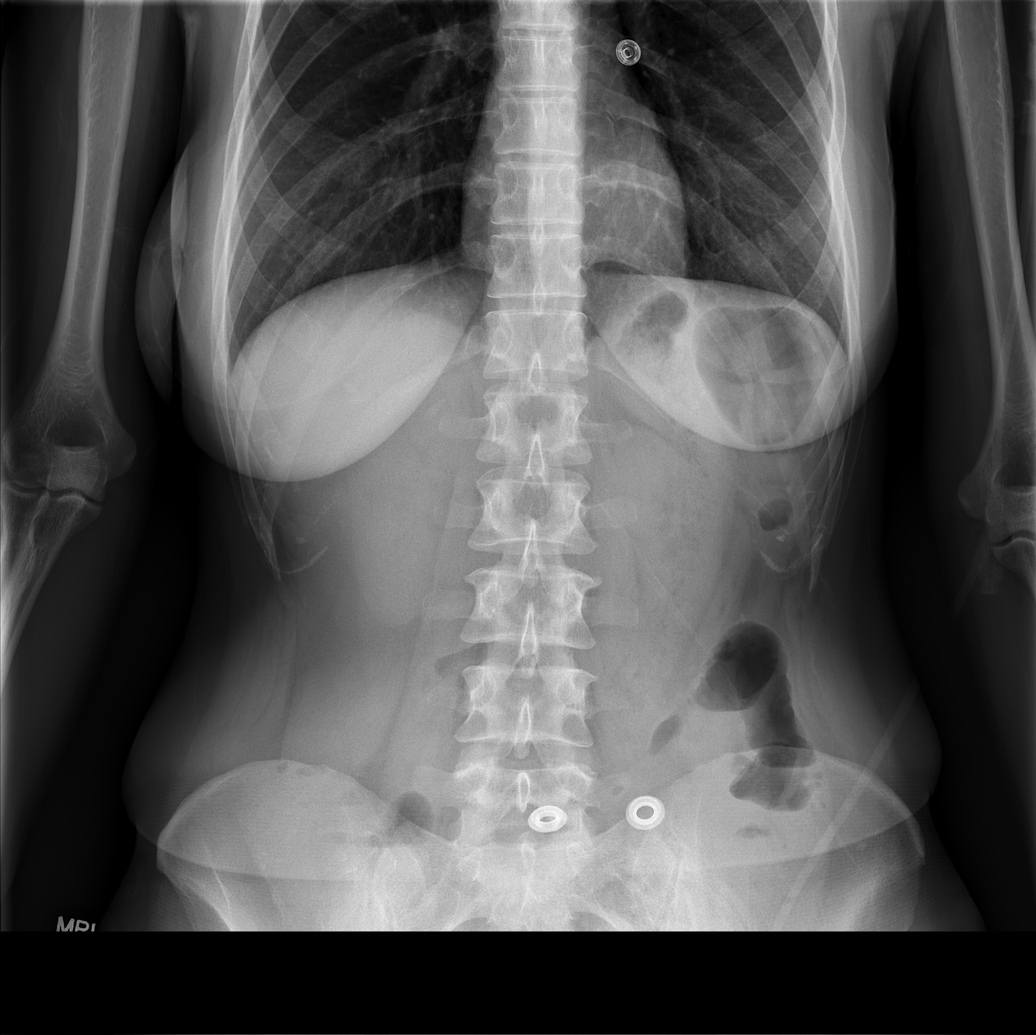

[t abdomen supine (1 of 2)]
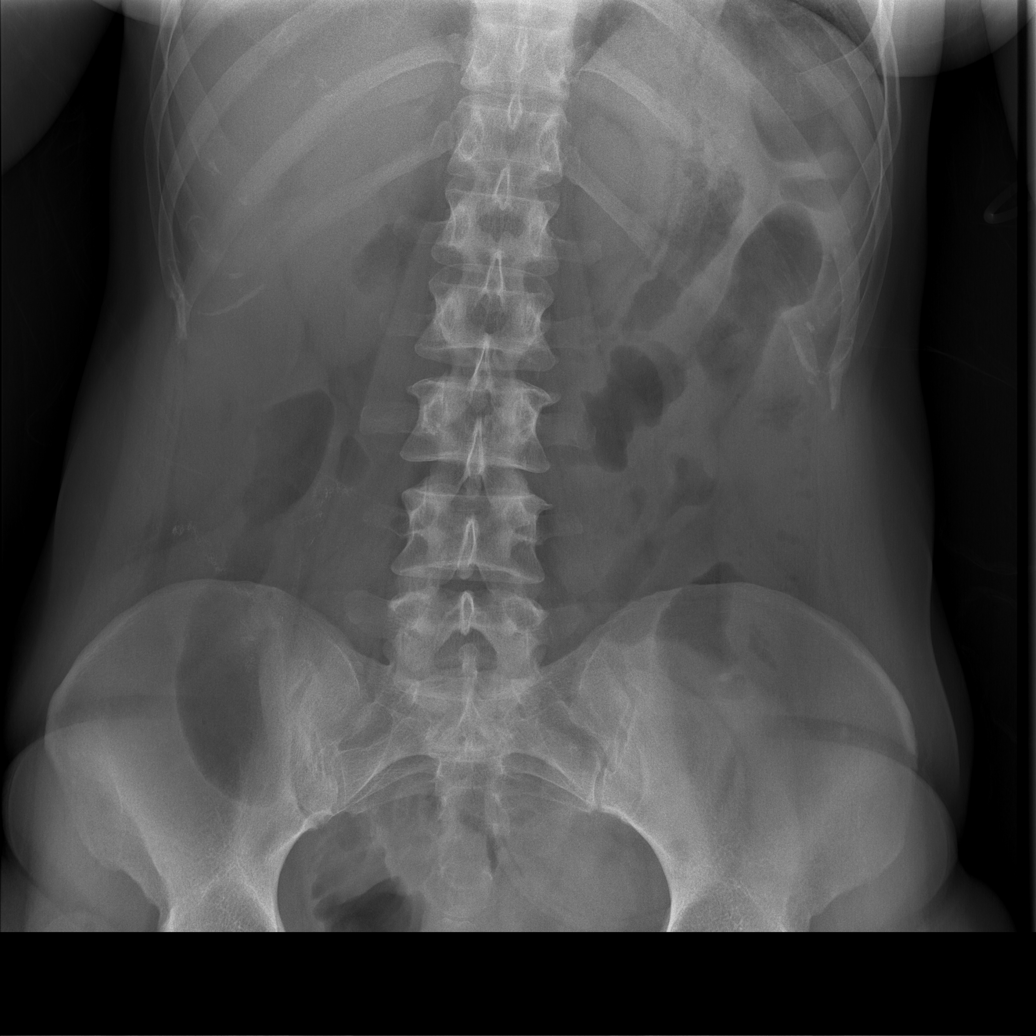

[t abdomen supine (2 of 2)]
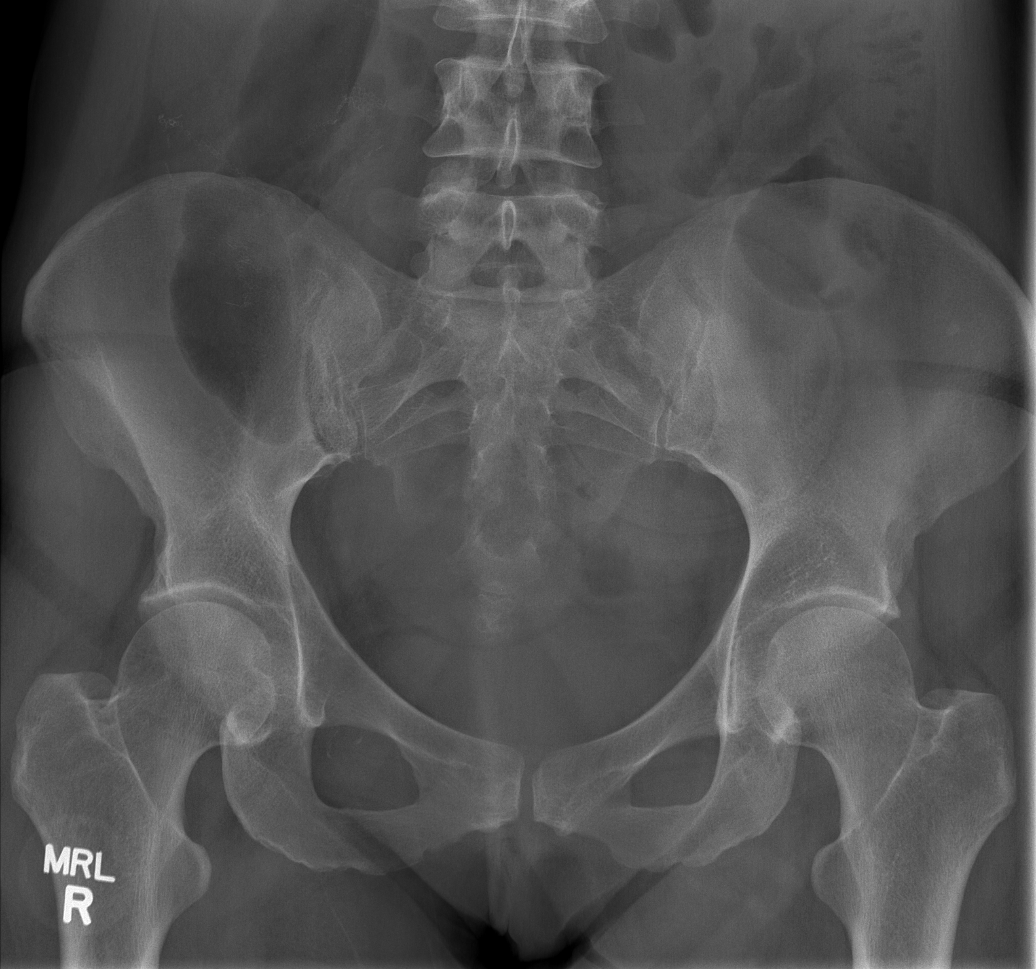

[4 of 4 positions shown; findings below may reference images not displayed]

FINDINGS: No active cardiopulmonary disease.  A Port-A-Cath is in
place on the left which is new since the prior study.

No free air or acute/specific abnormality of the bowel gas pattern.
Psoas margins intact.  Right abdominal surgical staples are noted.
No pathological calcifications.  Osseous structures intact.
IMPRESSION: 1.  No active cardiopulmonary disease.
2.  No acute or specific abdominal findings.  There are
postoperative changes in the right abdomen.

## 2012-06-18 ENCOUNTER — Ambulatory Visit (HOSPITAL_BASED_OUTPATIENT_CLINIC_OR_DEPARTMENT_OTHER): Payer: Managed Care, Other (non HMO)

## 2012-06-18 VITALS — BP 121/75 | HR 87 | Temp 97.0°F

## 2012-06-18 DIAGNOSIS — C50919 Malignant neoplasm of unspecified site of unspecified female breast: Secondary | ICD-10-CM

## 2012-06-18 DIAGNOSIS — Z452 Encounter for adjustment and management of vascular access device: Secondary | ICD-10-CM

## 2012-06-18 DIAGNOSIS — C50419 Malignant neoplasm of upper-outer quadrant of unspecified female breast: Secondary | ICD-10-CM

## 2012-06-18 MED ORDER — SODIUM CHLORIDE 0.9 % IJ SOLN
10.0000 mL | INTRAMUSCULAR | Status: DC | PRN
  Administered 2012-06-18: 10 mL via INTRAVENOUS
  Filled 2012-06-18: qty 10

## 2012-06-18 MED ORDER — HEPARIN SOD (PORK) LOCK FLUSH 100 UNIT/ML IV SOLN
500.0000 [IU] | Freq: Once | INTRAVENOUS | Status: AC
  Administered 2012-06-18: 500 [IU] via INTRAVENOUS
  Filled 2012-06-18: qty 5

## 2012-08-09 IMAGING — CR DG ABDOMEN 2V
2 series · 2 of 2 positions shown · non-contrast
Comparison: Abdomen films of 05/10/2010

CLINICAL DATA: Right mid abdomen pain

ABDOMEN - 2 VIEW

[w abdomen upright *]
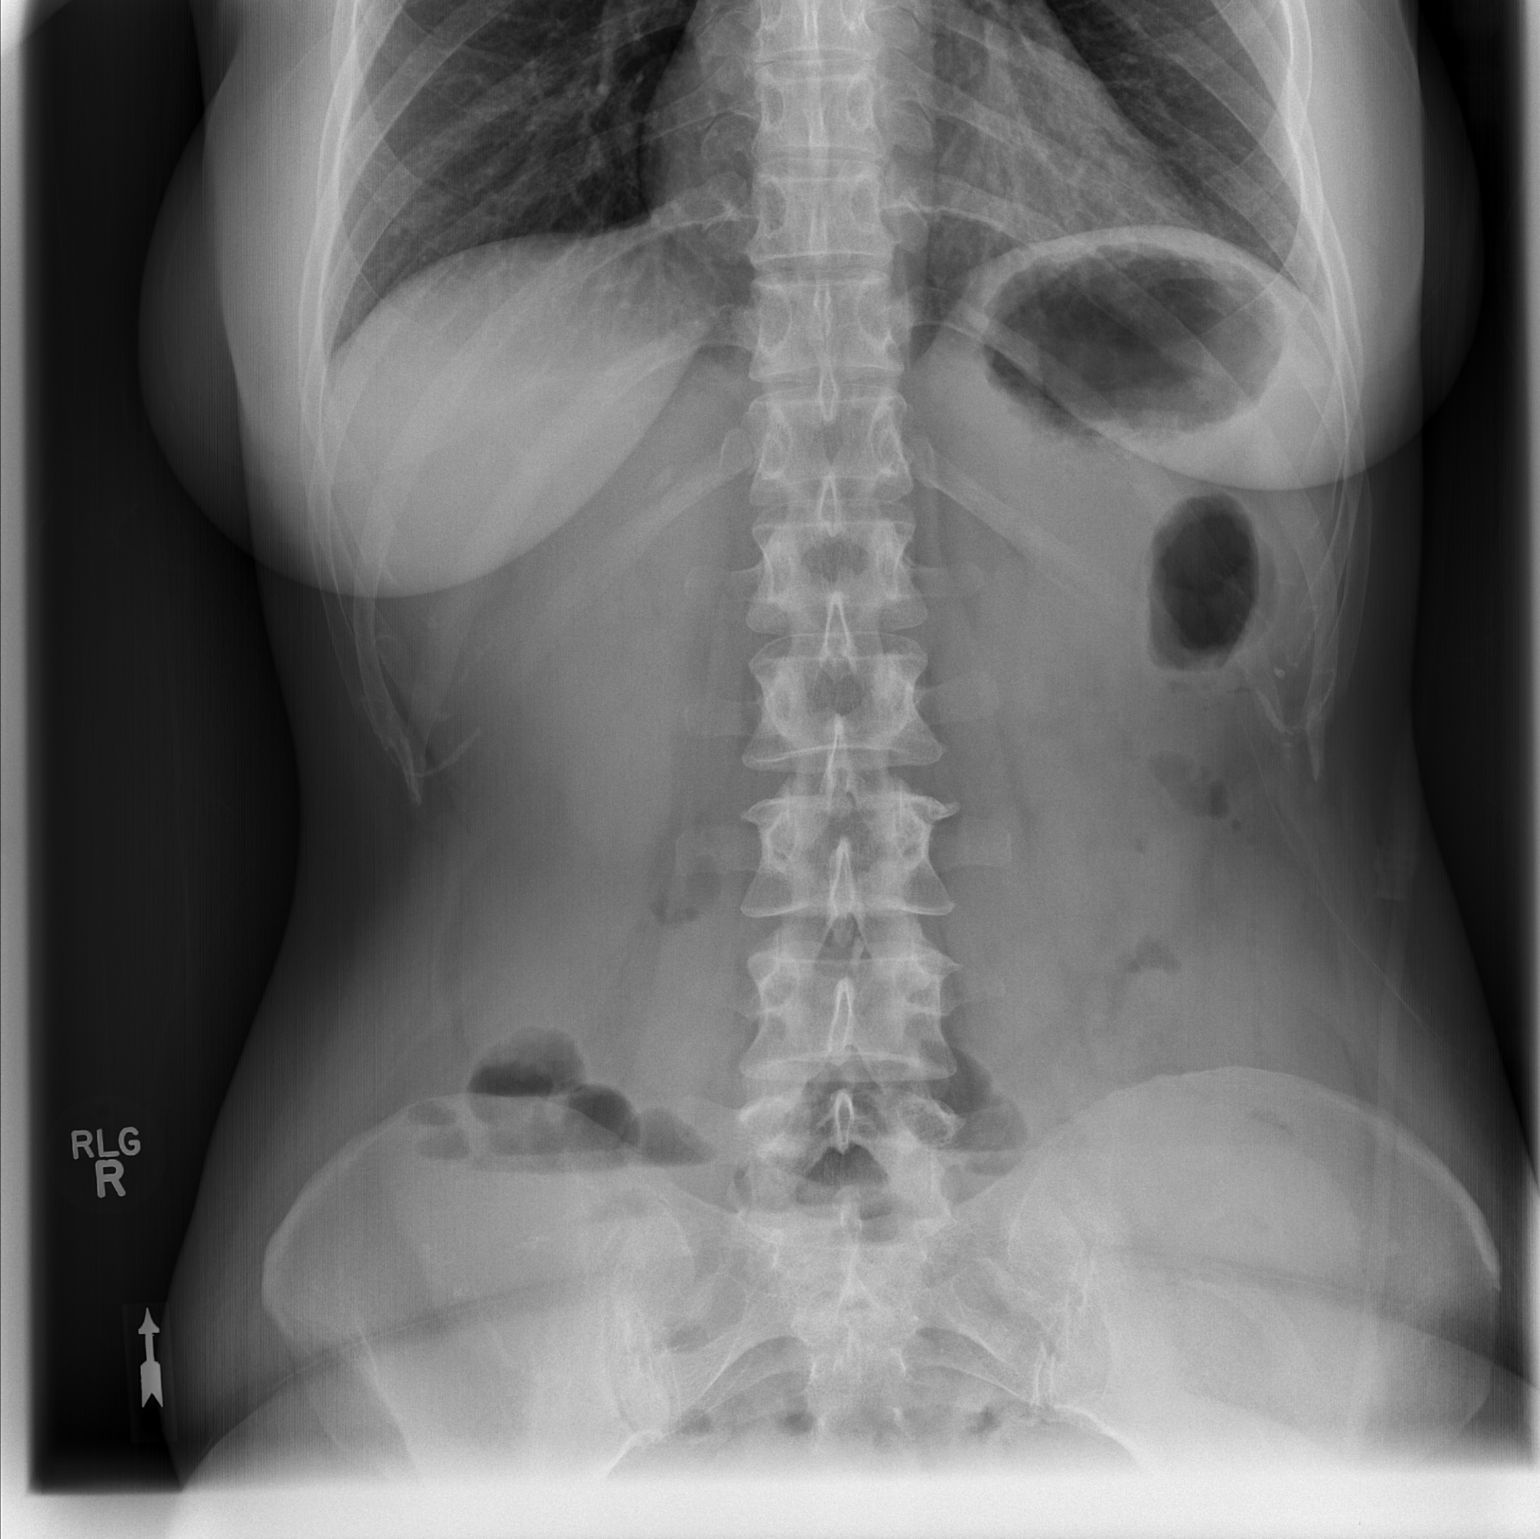

[t abdomen supine]
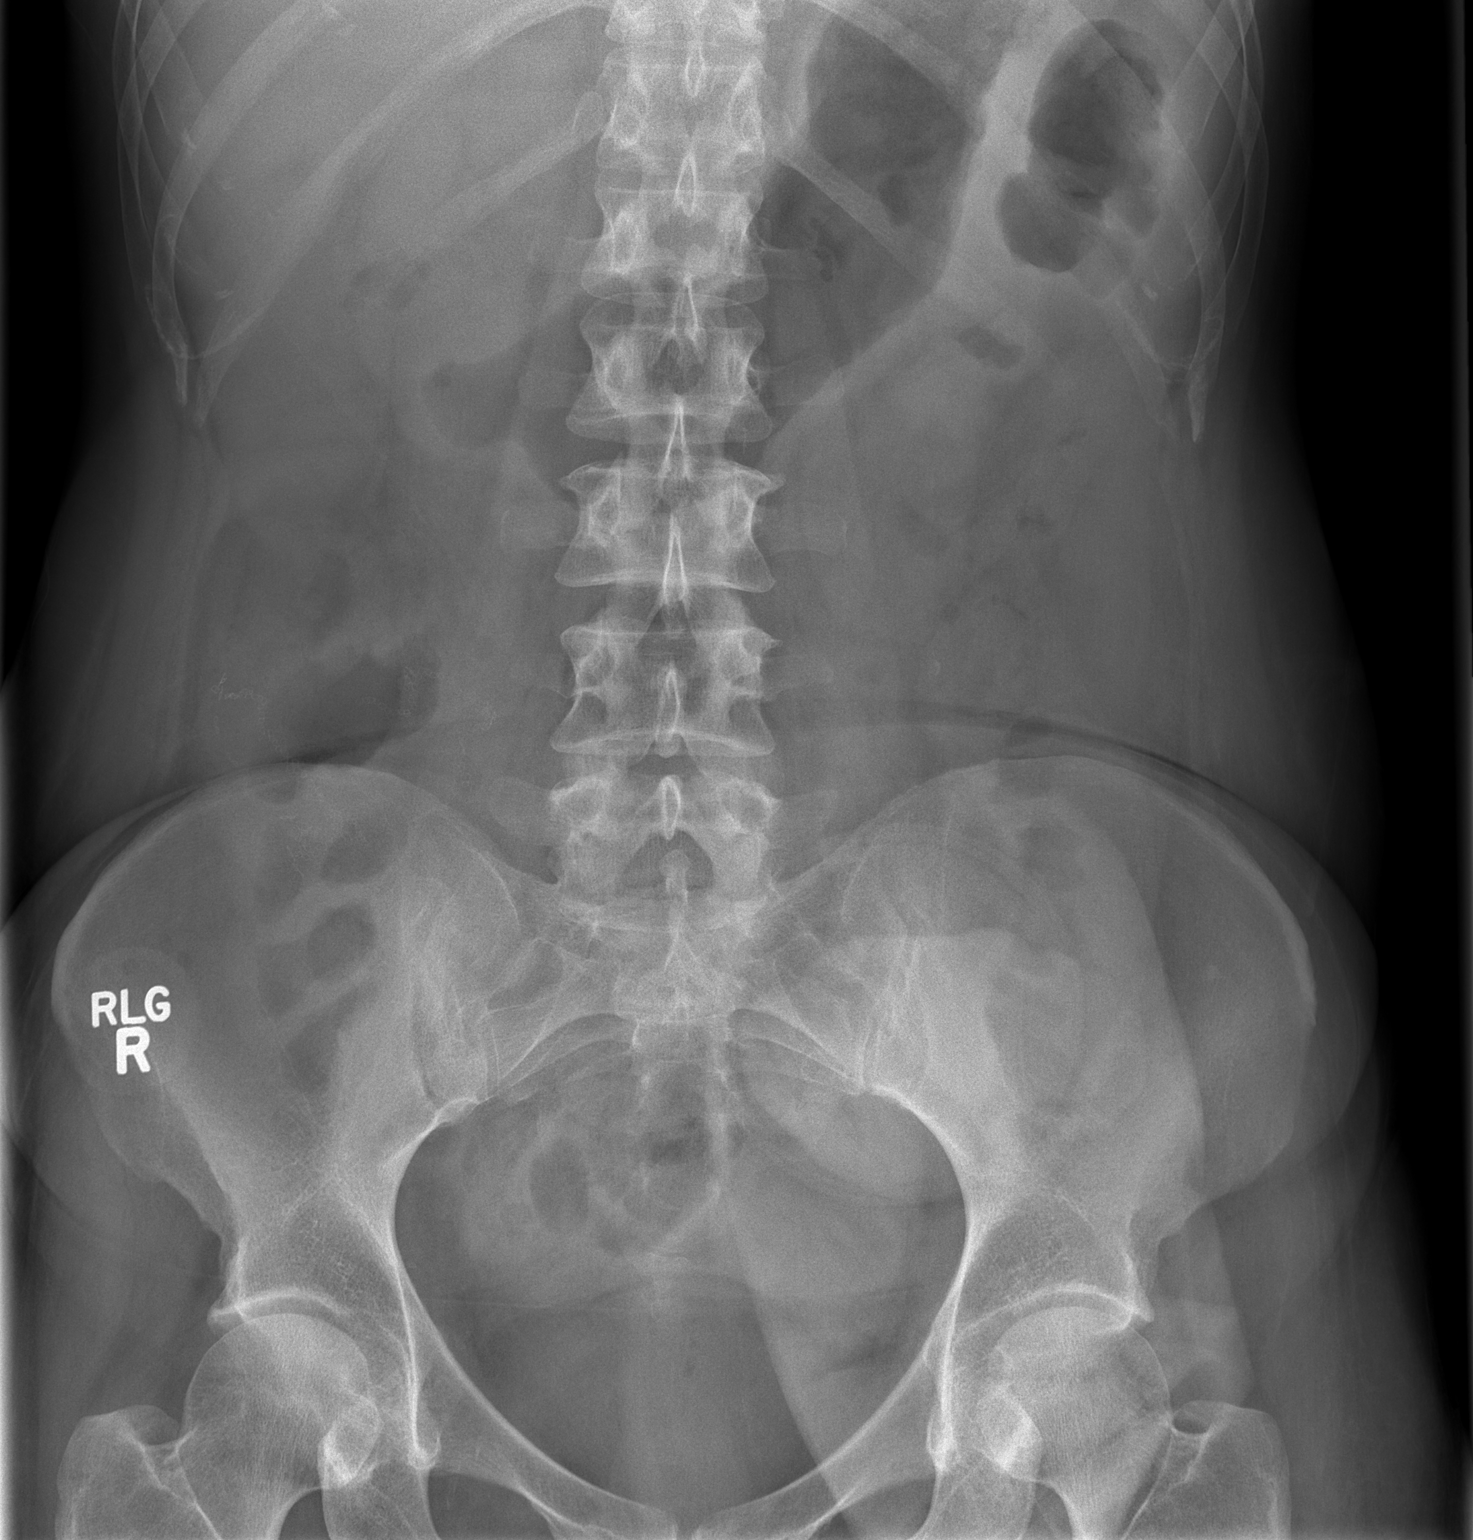

[2 of 2 positions shown; findings below may reference images not displayed]

FINDINGS: There is less gaseous distention of small bowel loops.  A
few air-fluid levels again are noted within the right lower
quadrant which may indicate mild partial SBO.  No free air is seen.
Some colonic bowel gas is present.
IMPRESSION: Decrease in gaseous distention of the small bowel.  Persistent air
fluid levels in the right lower quadrant.

## 2012-08-14 ENCOUNTER — Telehealth: Payer: Self-pay | Admitting: Oncology

## 2012-10-25 ENCOUNTER — Telehealth: Payer: Self-pay | Admitting: Oncology

## 2012-10-25 NOTE — Telephone Encounter (Signed)
, °

## 2012-11-08 ENCOUNTER — Other Ambulatory Visit: Payer: Managed Care, Other (non HMO) | Admitting: Lab

## 2012-11-08 ENCOUNTER — Encounter: Payer: Self-pay | Admitting: Adult Health

## 2012-11-08 ENCOUNTER — Telehealth: Payer: Self-pay | Admitting: Oncology

## 2012-11-08 ENCOUNTER — Other Ambulatory Visit (HOSPITAL_BASED_OUTPATIENT_CLINIC_OR_DEPARTMENT_OTHER): Payer: Managed Care, Other (non HMO) | Admitting: Lab

## 2012-11-08 ENCOUNTER — Ambulatory Visit: Payer: Managed Care, Other (non HMO) | Admitting: Oncology

## 2012-11-08 ENCOUNTER — Ambulatory Visit (HOSPITAL_BASED_OUTPATIENT_CLINIC_OR_DEPARTMENT_OTHER): Payer: Managed Care, Other (non HMO) | Admitting: Adult Health

## 2012-11-08 VITALS — BP 153/82 | HR 76 | Temp 98.4°F | Resp 20 | Ht 66.0 in | Wt 177.2 lb

## 2012-11-08 DIAGNOSIS — C50919 Malignant neoplasm of unspecified site of unspecified female breast: Secondary | ICD-10-CM

## 2012-11-08 DIAGNOSIS — Z170 Estrogen receptor positive status [ER+]: Secondary | ICD-10-CM

## 2012-11-08 DIAGNOSIS — N959 Unspecified menopausal and perimenopausal disorder: Secondary | ICD-10-CM

## 2012-11-08 DIAGNOSIS — M898X9 Other specified disorders of bone, unspecified site: Secondary | ICD-10-CM

## 2012-11-08 DIAGNOSIS — M2550 Pain in unspecified joint: Secondary | ICD-10-CM

## 2012-11-08 DIAGNOSIS — C50419 Malignant neoplasm of upper-outer quadrant of unspecified female breast: Secondary | ICD-10-CM

## 2012-11-08 DIAGNOSIS — IMO0001 Reserved for inherently not codable concepts without codable children: Secondary | ICD-10-CM

## 2012-11-08 LAB — COMPREHENSIVE METABOLIC PANEL (CC13)
ALT: 11 U/L (ref 0–55)
BUN: 15.9 mg/dL (ref 7.0–26.0)
CO2: 22 mEq/L (ref 22–29)
Creatinine: 0.9 mg/dL (ref 0.6–1.1)
Total Bilirubin: 0.31 mg/dL (ref 0.20–1.20)

## 2012-11-08 LAB — CBC WITH DIFFERENTIAL/PLATELET
BASO%: 0.9 % (ref 0.0–2.0)
Basophils Absolute: 0.1 10*3/uL (ref 0.0–0.1)
EOS%: 2.7 % (ref 0.0–7.0)
HCT: 38.6 % (ref 34.8–46.6)
HGB: 12.7 g/dL (ref 11.6–15.9)
LYMPH%: 22.8 % (ref 14.0–49.7)
MCH: 31.2 pg (ref 25.1–34.0)
MCHC: 32.9 g/dL (ref 31.5–36.0)
NEUT%: 67.5 % (ref 38.4–76.8)
Platelets: 250 10*3/uL (ref 145–400)

## 2012-11-08 MED ORDER — EXEMESTANE 25 MG PO TABS: 25.0000 mg | ORAL_TABLET | Freq: Every day | ORAL | Status: DC

## 2012-11-08 NOTE — Progress Notes (Addendum)
OFFICE PROGRESS NOTE    Lilia Argue [address] [place] [address] Chevis Pretty, MD Lonie Peak, MD Dr. 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  PRIOR THERAPY:  #1 patient was originally diagnosed in December 2011. She received neoadjuvant chemotherapy in the originally consisting of Taxotere carboplatinum and Herceptin from from December 2011 to March 2012.  #2 patient then underwent bilateral mastectomies on 07/27/2010 with the final pathology of the left breast showing benign breast tissue with fibrocystic changes and columnar hyperplasia no atypia or malignancy was noted one sentinel node was negative for metastatic disease or any kind of malignancies. 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  #3 patient declined any radiation therapy.  #4 patient has completed a year of herceptin 07/21/11   #5. Letrozole 2.5 mg daily a total of 5 years of therapy planned.  She stopped the Letrozole therapy on May 2014  CURRENT THERAPY: Letrozole 2.5 mg daily  INTERVAL HISTORY: JENASIS STRALEY 52 y.o. female returns for followup visit today. She's doing well today.  She developed severe joint pain, muscle aches and intolerable hot flashes.  She could not take any more, and stopped the therapy due to its impact on her quality of life.  She works at [employer] in [employer], and her job is slightly more intense and it began to interfere with her work so she stopped the Letrozole.  Since stopping Letrozole therapy, the joint aches/muscle pains remain, slightly less intense than prior, however the hotflashes are less intense.  She denies fevers, chills,  nausea, vomiting, unintentional weight loss, or any further concerns.   MEDICAL HISTORY: Past Medical History  Diagnosis Date  . Thyroid disease   . Breast cancer   . GERD (gastroesophageal reflux disease)   . Wears glasses   . Crohn's   . History of small bowel obstruction   . Depression   . Anemia 02/03/2011  . Depression 02/25/2011    ALLERGIES:  is allergic to amoxicillin-pot clavulanate and doxycycline.  MEDICATIONS:  Current Outpatient Prescriptions  Medication Sig Dispense Refill  . levothyroxine (SYNTHROID, LEVOTHROID) 175 MCG tablet Take 175 mcg by mouth daily.      Marland Kitchen exemestane (AROMASIN) 25 MG tablet Take 1 tablet (25 mg total) by mouth daily after breakfast.  30 tablet  6  . valACYclovir (VALTREX) 1000 MG tablet Take 1,000 mg by mouth as needed.      . [DISCONTINUED] omeprazole (PRILOSEC) 20 MG capsule Take 20 mg by mouth daily.       No current facility-administered medications for this visit.   Facility-Administered Medications Ordered in Other Visits  Medication Dose Route Frequency Provider Last Rate Last Dose  . 0.9 %  sodium chloride infusion   Intravenous Once Victorino December, MD      . diphenhydrAMINE (BENADRYL) injection 25 mg  25 mg Intravenous Once Victorino December, MD      . sodium chloride 0.9 % injection 10 mL  10 mL Intravenous PRN Victorino December, MD   10 mL at 04/23/12 1058    SURGICAL HISTORY:  Past Surgical History  Procedure Laterality Date  . Colon resection  2000, 2001    with colostomy  . Mastectomy  2012    bilateral    REVIEW OF SYSTEMS:  A 10 point review of systems was conducted and is otherwise negative except for what is noted above.    Health Maintenance  Mammogram: n/a Colonoscopy: 11/2010, Crohns disease, f/u annually Bone Density Scan: unknown Pap Smear: 2012 Eye Exam: June 2013 Vitamin D Level: unknown Lipid Panel: 2013   PHYSICAL EXAMINATION:  BP 153/82  Pulse 76  Temp(Src) 98.4 F (36.9 C) (Oral)  Resp 20  Ht 5'  6" (1.676 m)  Wt 177 lb 3.2 oz (80.377 kg)  BMI 28.61 kg/m2 General: Patient is a well appearing female in no acute distress HEENT: PERRLA, sclerae anicteric no conjunctival pallor, MMM Neck: supple, no palpable adenopathy Lungs: clear to auscultation bilaterally, no wheezes, rhonchi, or rales Cardiovascular: regular rate rhythm, S1, S2, no murmurs, rubs or gallops Abdomen: Soft, non-tender, non-distended, normoactive bowel sounds, no HSM Extremities: warm and well perfused, no clubbing, cyanosis, or edema Skin: No rashes or lesions Neuro: Non-focal Bilateral reconstructed breasts are examined there is no evidence of any kind and nodularity on the skin.  ECOG PERFORMANCE STATUS: 0 - Asymptomatic    LABORATORY DATA: Lab Results  Component Value Date   WBC 7.9 11/08/2012   HGB 12.7 11/08/2012   HCT 38.6 11/08/2012   MCV 94.6 11/08/2012   PLT 250 11/08/2012      Chemistry      Component Value Date/Time   NA 144 11/08/2012 0849   NA 141 02/24/2011 0928   K 4.6 11/08/2012 0849   K 4.0 02/24/2011 0928   CL 105 04/23/2012 0948   CL 109 02/24/2011 0928   CO2 22 11/08/2012 0849   CO2 23 02/24/2011 0928   BUN 15.9 11/08/2012 0849   BUN 17 02/24/2011 0928   CREATININE 0.9 11/08/2012 0849   CREATININE 0.89 02/24/2011 0928      Component Value Date/Time   CALCIUM 9.7 11/08/2012 0849   CALCIUM 8.0* 02/24/2011 0928   ALKPHOS 104 11/08/2012 0849   ALKPHOS 61 02/24/2011 0928   AST 15 11/08/2012 0849   AST 15 02/24/2011 0928   ALT 11 11/08/2012 0849   ALT 12 02/24/2011 0928   BILITOT 0.31 11/08/2012 0849   BILITOT 0.2* 02/24/2011 0928       RADIOGRAPHIC STUDIES:  No results found.  ASSESSMENT: 52 year old female with:  1.  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 After completion of neoadjuvant chemotherapy and having had a mastectomy patient went on to receive one full year of Herceptin which she completed in June  2013. She was also begun on letrozole 2.5 mg on a daily basis starting after her surgery, and was switched to aromasin on 11/08/12. Thus far she has no evidence of recurrent disease.  #2 patient is status post bilateral breast reconstruction.  #3 history of Crohn's disease.  PLAN: #1 patient is doing well.  No sign of recurrence.  She has stopped the Letrozole about 3-4 months ago.  She is willing to try another anti-estrogen therapy.  I prescribed Aromasin.  She will also have a bone scan due to the bone pain/and aches.  I gave the patient a hand out on Aromasin in her AVS.    #2 Pt will return in 3 months for labs and evaluation.    All questions were answered. The patient knows to call the clinic with any problems, questions or concerns. We can certainly see the patient much sooner  if necessary.  I spent 25 minutes counseling the patient face to face. The total time spent in the appointment was 30 minutes.  Cherie Ouch Lyn Hollingshead, NP Medical Oncology [facility] Phone: [phone]  ATTENDING'S ATTESTATION:  I personally reviewed patient's chart, examined patient myself, formulated the treatment plan as followed.    Overall Ms. Hoefling is doing well. She has no evidence of recurrent breast cancer. Since her tumor was ER positive she is an aromatase inhibitor. She seems to be tolerating it relatively well. Although she does complain of some aches and pains. She does have history of Crohn's disease and that has been doing quite well without any significant problems recently. She is followed by GI.  We'll continue to see her every 6 months of course I. Can see her sooner if need arises. Patient was accompanied by her husband today. Drue Second, MD Medical/Oncology [facility] [phone] (beeper) [phone] (Office)

## 2012-11-08 NOTE — Patient Instructions (Addendum)
Exemestane tablets What is this medicine? EXEMESTANE (ex e MES tane) blocks the production of the hormone estrogen. Some types of breast cancer depend on estrogen to grow, and this medicine can stop tumor growth by blocking estrogen production. This medicine is for the treatment of breast cancer in postmenopausal women only. This medicine may be used for other purposes; ask your health care provider or pharmacist if you have questions. What should I tell my health care provider before I take this medicine? They need to know if you have any of these conditions: -an unusual or allergic reaction to exemestane, other medicines, foods, dyes, or preservatives -pregnant or trying to get pregnant -breast-feeding How should I use this medicine? Take this medicine by mouth with a glass of water. Follow the directions on the prescription label. Take your doses at regular intervals after a meal. Do not take your medicine more often than directed. Do not stop taking except on the advice of your doctor or health care professional. Contact your pediatrician regarding the use of this medicine in children. Special care may be needed. Overdosage: If you think you have taken too much of this medicine contact a poison control center or emergency room at once. NOTE: This medicine is only for you. Do not share this medicine with others. What if I miss a dose? If you miss a dose, take the next dose as usual. Do not try to make up the missed dose. Do not take double or extra doses. What may interact with this medicine? Do not take this medicine with any of the following medications: -female hormones, like estrogens and birth control pills This medicine may also interact with the following medications: -androstenedione -phenytoin -rifabutin, rifampin, or rifapentine -St. John's Wort This list may not describe all possible interactions. Give your health care provider a list of all the medicines, herbs,  non-prescription drugs, or dietary supplements you use. Also tell them if you smoke, drink alcohol, or use illegal drugs. Some items may interact with your medicine. What should I watch for while using this medicine? Visit your doctor or health care professional for regular checks on your progress. If you experience hot flashes or sweating while taking this medicine, avoid alcohol, smoking and drinks with caffeine. This may help to decrease these side effects. What side effects may I notice from receiving this medicine? Side effects that you should report to your doctor or health care professional as soon as possible: -any new or unusual symptoms -changes in vision -fever -leg or arm swelling -pain in bones, joints, or muscles -pain in hips, back, ribs, arms, shoulders, or legs Side effects that usually do not require medical attention (report to your doctor or health care professional if they continue or are bothersome): -difficulty sleeping -headache -hot flashes -sweating -unusually weak or tired This list may not describe all possible side effects. Call your doctor for medical advice about side effects. You may report side effects to FDA at 1-800-FDA-1088. Where should I keep my medicine? Keep out of the reach of children. Store at room temperature between 15 and 30 degrees C (59 and 86 degrees F). Throw away any unused medicine after the expiration date. NOTE: This sheet is a summary. It may not cover all possible information. If you have questions about this medicine, talk to your doctor, pharmacist, or health care provider.  2013, [employer]vier/[employer] Standard. (06/05/2007 11:48:29 AM)  

## 2012-11-27 ENCOUNTER — Encounter (HOSPITAL_COMMUNITY)
Admission: RE | Admit: 2012-11-27 | Discharge: 2012-11-27 | Disposition: A | Discharge status: Home / Self Care | Payer: Managed Care, Other (non HMO) | Source: Ambulatory Visit | Attending: Adult Health | Admitting: Adult Health

## 2012-11-27 ENCOUNTER — Encounter (HOSPITAL_COMMUNITY): Payer: Self-pay

## 2012-11-27 ENCOUNTER — Telehealth: Payer: Self-pay | Admitting: *Deleted

## 2012-11-27 DIAGNOSIS — M898X9 Other specified disorders of bone, unspecified site: Secondary | ICD-10-CM

## 2012-11-27 DIAGNOSIS — M899 Disorder of bone, unspecified: Secondary | ICD-10-CM | POA: Insufficient documentation

## 2012-11-27 DIAGNOSIS — C50919 Malignant neoplasm of unspecified site of unspecified female breast: Secondary | ICD-10-CM | POA: Insufficient documentation

## 2012-11-27 MED ORDER — TECHNETIUM TC 99M MEDRONATE IV KIT
25.0000 | PACK | Freq: Once | INTRAVENOUS | Status: AC | PRN
  Administered 2012-11-27: 25 via INTRAVENOUS

## 2012-11-27 NOTE — Telephone Encounter (Signed)
Per NP, notified pt -Bone scan results reviewed , your scan looks good, nothing to make [name] think there is cancer in the bones.

## 2012-11-27 NOTE — Telephone Encounter (Signed)
Message copied by Cooper Render on Tue Nov 27, 2012  5:21 PM ------      Message from: Illa Level      Created: Tue Nov 27, 2012  4:28 PM       Please call patient and give results.      L      ----- Message -----         From: Rad Results In Interface         Sent: 11/27/2012  10:28 AM           To: Illa Level, NP                   ------

## 2012-12-03 ENCOUNTER — Ambulatory Visit: Payer: Managed Care, Other (non HMO)

## 2012-12-03 NOTE — Progress Notes (Unsigned)
Patient states she is not going to make her flush appointment today. Offered patient to speak with a scheduler to reschedule appointment. Patient stated she will call us back to reschedule the appointment.

## 2013-02-18 ENCOUNTER — Other Ambulatory Visit: Payer: Managed Care, Other (non HMO)

## 2013-02-18 ENCOUNTER — Encounter: Payer: Managed Care, Other (non HMO) | Admitting: Adult Health

## 2013-02-18 ENCOUNTER — Telehealth: Payer: Self-pay | Admitting: *Deleted

## 2013-02-18 ENCOUNTER — Other Ambulatory Visit: Payer: Self-pay | Admitting: *Deleted

## 2013-02-18 NOTE — Telephone Encounter (Signed)
Lm gv appt for 02/27/13 w/ labs@ 1:45pm and ov@ 2:15pm. Made pt aware that i will mail a letter/avs...td

## 2013-02-19 NOTE — Progress Notes (Signed)
This encounter was created in error - please disregard.

## 2013-02-25 ENCOUNTER — Telehealth: Payer: Self-pay | Admitting: Oncology

## 2013-02-25 NOTE — Telephone Encounter (Signed)
, °

## 2013-02-27 ENCOUNTER — Ambulatory Visit: Payer: Managed Care, Other (non HMO) | Admitting: Adult Health

## 2013-02-27 ENCOUNTER — Other Ambulatory Visit: Payer: Managed Care, Other (non HMO)

## 2013-03-21 ENCOUNTER — Ambulatory Visit: Payer: Managed Care, Other (non HMO) | Admitting: Adult Health

## 2013-03-21 ENCOUNTER — Other Ambulatory Visit: Payer: Managed Care, Other (non HMO)

## 2013-04-18 ENCOUNTER — Telehealth: Payer: Self-pay | Admitting: Adult Health

## 2013-04-18 NOTE — Telephone Encounter (Signed)
, °

## 2013-04-25 ENCOUNTER — Other Ambulatory Visit (HOSPITAL_BASED_OUTPATIENT_CLINIC_OR_DEPARTMENT_OTHER): Payer: Managed Care, Other (non HMO)

## 2013-04-25 ENCOUNTER — Ambulatory Visit (HOSPITAL_BASED_OUTPATIENT_CLINIC_OR_DEPARTMENT_OTHER): Payer: Managed Care, Other (non HMO) | Admitting: Hematology and Oncology

## 2013-04-25 ENCOUNTER — Telehealth: Payer: Self-pay | Admitting: Hematology and Oncology

## 2013-04-25 VITALS — BP 140/84 | HR 80 | Temp 98.4°F | Resp 20 | Ht 66.0 in | Wt 202.4 lb

## 2013-04-25 DIAGNOSIS — F329 Major depressive disorder, single episode, unspecified: Secondary | ICD-10-CM

## 2013-04-25 DIAGNOSIS — C50919 Malignant neoplasm of unspecified site of unspecified female breast: Secondary | ICD-10-CM

## 2013-04-25 DIAGNOSIS — R600 Localized edema: Secondary | ICD-10-CM

## 2013-04-25 DIAGNOSIS — K5090 Crohn's disease, unspecified, without complications: Secondary | ICD-10-CM

## 2013-04-25 DIAGNOSIS — F3289 Other specified depressive episodes: Secondary | ICD-10-CM

## 2013-04-25 DIAGNOSIS — R5383 Other fatigue: Secondary | ICD-10-CM

## 2013-04-25 DIAGNOSIS — R609 Edema, unspecified: Secondary | ICD-10-CM

## 2013-04-25 DIAGNOSIS — C50419 Malignant neoplasm of upper-outer quadrant of unspecified female breast: Secondary | ICD-10-CM

## 2013-04-25 DIAGNOSIS — R5381 Other malaise: Secondary | ICD-10-CM

## 2013-04-25 DIAGNOSIS — Z79890 Hormone replacement therapy (postmenopausal): Secondary | ICD-10-CM

## 2013-04-25 LAB — COMPREHENSIVE METABOLIC PANEL (CC13)
ALT: 20 U/L (ref 0–55)
AST: 16 U/L (ref 5–34)
Albumin: 3.8 g/dL (ref 3.5–5.0)
Alkaline Phosphatase: 93 U/L (ref 40–150)
Anion Gap: 12 mEq/L — ABNORMAL HIGH (ref 3–11)
BUN: 18.6 mg/dL (ref 7.0–26.0)
CALCIUM: 9.6 mg/dL (ref 8.4–10.4)
CO2: 21 meq/L — AB (ref 22–29)
CREATININE: 1 mg/dL (ref 0.6–1.1)
Chloride: 110 mEq/L — ABNORMAL HIGH (ref 98–109)
GLUCOSE: 131 mg/dL (ref 70–140)
Potassium: 4.3 mEq/L (ref 3.5–5.1)
Sodium: 143 mEq/L (ref 136–145)
Total Bilirubin: 0.51 mg/dL (ref 0.20–1.20)
Total Protein: 6.9 g/dL (ref 6.4–8.3)

## 2013-04-25 LAB — CBC WITH DIFFERENTIAL/PLATELET
BASO%: 0.9 % (ref 0.0–2.0)
Basophils Absolute: 0.1 10*3/uL (ref 0.0–0.1)
EOS ABS: 0.1 10*3/uL (ref 0.0–0.5)
EOS%: 1.9 % (ref 0.0–7.0)
HEMATOCRIT: 39.2 % (ref 34.8–46.6)
HEMOGLOBIN: 12.8 g/dL (ref 11.6–15.9)
LYMPH#: 1.5 10*3/uL (ref 0.9–3.3)
LYMPH%: 23.4 % (ref 14.0–49.7)
MCH: 31.8 pg (ref 25.1–34.0)
MCHC: 32.6 g/dL (ref 31.5–36.0)
MCV: 97.6 fL (ref 79.5–101.0)
MONO#: 0.4 10*3/uL (ref 0.1–0.9)
MONO%: 6.5 % (ref 0.0–14.0)
NEUT%: 67.3 % (ref 38.4–76.8)
NEUTROS ABS: 4.3 10*3/uL (ref 1.5–6.5)
PLATELETS: 250 10*3/uL (ref 145–400)
RBC: 4.01 10*6/uL (ref 3.70–5.45)
RDW: 14 % (ref 11.2–14.5)
WBC: 6.4 10*3/uL (ref 3.9–10.3)

## 2013-04-25 NOTE — Telephone Encounter (Signed)
, °

## 2013-05-09 ENCOUNTER — Encounter: Payer: Self-pay | Admitting: Hematology and Oncology

## 2013-05-09 NOTE — Progress Notes (Signed)
Marland Kitchen  OFFICE PROGRESS NOTE    [name] [address] [place] [address] Autumn Messing, MD Eppie Gibson, MD Dr. 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  PRIOR THERAPY:  #1 patient was originally diagnosed in December 2011. She received neoadjuvant chemotherapy in the originally consisting of Taxotere carboplatinum and Herceptin from from December 2011 to March 2012.  #2 patient then underwent bilateral mastectomies on 07/27/2010 with the final pathology of the left breast showing benign breast tissue with fibrocystic changes and columnar hyperplasia no atypia or malignancy was noted one sentinel node was negative for metastatic disease or any kind of malignancies. 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  #3 patient declined any radiation therapy.  #4 patient has completed a year of herceptin 07/21/11   #5. Letrozole 2.5 mg daily a total of 5 years of therapy planned.  She stopped the Letrozole therapy on May 2014  CURRENT THERAPY: Letrozole 2.5 mg daily  INTERVAL HISTORY: [name] 53 y.o. female returns for followup visit today.she is doing fair.She states aching all over,depressed and very fatigued.She denies severe localized bone pain.She is concerned of leg edema.she states legs get very red swollen with cracked skin at the end of the day after work.She takes her Aromasin with few missed doses. She has come off her antidepressants.  ROS She denies fevers, chills, nausea, vomiting, unintentional weight loss, or any further concerns.   MEDICAL HISTORY: Past Medical History  Diagnosis Date  . Thyroid disease   . Breast cancer   . GERD (gastroesophageal  reflux disease)   . Wears glasses   . Crohn's   . History of small bowel obstruction   . Depression   . Anemia 02/03/2011  . Depression 02/25/2011    ALLERGIES:  is allergic to amoxicillin-pot clavulanate and doxycycline.  MEDICATIONS:  Current Outpatient Prescriptions  Medication Sig Dispense Refill  . ABILIFY 5 MG tablet       . amphetamine-dextroamphetamine (ADDERALL XR) 25 MG 24 hr capsule       . levothyroxine (SYNTHROID, LEVOTHROID) 175 MCG tablet Take 175 mcg by mouth daily.      Marland Kitchen exemestane (AROMASIN) 25 MG tablet Take 1 tablet (25 mg total) by mouth daily after breakfast.  30 tablet  6  . valACYclovir (VALTREX) 1000 MG tablet Take 1,000 mg by mouth as needed.      . Venlafaxine HCl 225 MG TB24       . [DISCONTINUED] omeprazole (PRILOSEC) 20 MG capsule Take 20 mg by mouth daily.       No current facility-administered medications for this visit.   Facility-Administered Medications Ordered in Other Visits  Medication Dose Route Frequency Provider Last Rate Last Dose  . 0.9 %  sodium chloride infusion   Intravenous Once Deatra Robinson, MD      . diphenhydrAMINE (BENADRYL) injection 25 mg  25 mg Intravenous Once Deatra Robinson, MD      . sodium chloride 0.9 % injection 10 mL  10 mL Intravenous PRN Deatra Robinson, MD   10 mL at 04/23/12 1058    SURGICAL HISTORY:  Past Surgical History  Procedure Laterality Date  . Colon resection  2000, 2001    with colostomy  .  Mastectomy  2012    bilateral    REVIEW OF SYSTEMS:  A 10 point review of systems was conducted and is otherwise negative except for what is noted above.    Health Maintenance  Mammogram: n/a Colonoscopy: 11/2010, Crohns disease, f/u annually Bone Density Scan: unknown Pap Smear: 2012 Eye Exam: June 2013 Vitamin D Level: unknown Lipid Panel: 2013   PHYSICAL EXAMINATION:  BP 140/84  Pulse 80  Temp(Src) 98.4 F (36.9 C) (Oral)  Resp 20  Ht _0  (1.676 m)  Wt 202 lb 6.4 oz (91.808 kg)  BMI 32.68  kg/m2 General: Patient is a well appearing female in no acute distress HEENT: PERRLA, sclerae anicteric no conjunctival pallor, MMM Neck: supple, no palpable adenopathy Lungs: clear to auscultation bilaterally, no wheezes, rhonchi, or rales Cardiovascular: regular rate rhythm, S1, S2, no murmurs, rubs or gallops Abdomen: Soft, non-tender, non-distended, normoactive bowel sounds, no HSM Extremities: warm and well perfused, no clubbing, cyanosis, or edema Skin: No rashes or lesions Neuro: Non-focal Bilateral reconstructed breasts are examined there is no evidence of any kind and nodularity on the skin.  ECOG PERFORMANCE STATUS: 1    LABORATORY DATA: Lab Results  Component Value Date   WBC 6.4 04/25/2013   HGB 12.8 04/25/2013   HCT 39.2 04/25/2013   MCV 97.6 04/25/2013   PLT 250 04/25/2013      Chemistry      Component Value Date/Time   NA 143 04/25/2013 1024   NA 141 02/24/2011 0928   K 4.3 04/25/2013 1024   K 4.0 02/24/2011 0928   CL 105 04/23/2012 0948   CL 109 02/24/2011 0928   CO2 21* 04/25/2013 1024   CO2 23 02/24/2011 0928   BUN 18.6 04/25/2013 1024   BUN 17 02/24/2011 0928   CREATININE 1.0 04/25/2013 1024   CREATININE 0.89 02/24/2011 0928      Component Value Date/Time   CALCIUM 9.6 04/25/2013 1024   CALCIUM 8.0* 02/24/2011 0928   ALKPHOS 93 04/25/2013 1024   ALKPHOS 61 02/24/2011 0928   AST 16 04/25/2013 1024   AST 15 02/24/2011 0928   ALT 20 04/25/2013 1024   ALT 12 02/24/2011 0928   BILITOT 0.51 04/25/2013 1024   BILITOT 0.2* 02/24/2011 0928     DATA: Breast cancer. Left hip pain.  EXAM:  NUCLEAR MEDICINE WHOLE BODY BONE SCAN  TECHNIQUE:  Whole body anterior and posterior images were obtained approximately  3 hours after intravenous injection of radiopharmaceutical.  COMPARISON: None.  RADIOPHARMACEUTICALS: 25.0 [employer]etium99 MDP  FINDINGS:  No areas of abnormal osseous uptake are identified to suggest  metastatic bone disease. Minimal areas of degenerative type uptake   are noted. If pain persists MRI may be helpful for further  evaluation of the left hip.  IMPRESSION:  Unremarkable whole body bone scan. No findings to suggest osseous  metastatic disease.  Electronically Signed  By: Kalman Jewels M.D.  On: 11/27/2012 10:26   RADIOGRAPHIC STUDIES:  No results found.  ASSESSMENT: 53 year old female with:  1.  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 After completion of neoadjuvant chemotherapy and having had a mastectomy patient went on to receive one full year of Herceptin which she completed in June 2013. She was also begun on letrozole 2.5 mg on a daily basis starting after her surgery, and was switched to aromasin on 11/08/12. Thus far she has no evidence of recurrent disease.  #2 patient is status post bilateral  breast reconstruction.  #3 history of Crohn's disease.  PLAN: 1 patient is doing well.  No sign of recurrence.Bone scan negative.Continue on Aromasin. Will schedule Bone Density. Continue Calcium 600 mg 2 daily and Vit D 2000 IU one daily.  Monitor thyroid with Primary MD  Bilateral leg edema will check ECHO had prior Herceptin treatment no signs of cellulitis at present.   Pt will return in 6 months for labs and evaluation. CBC,CMP,Vit D level   All questions were answered. The patient knows to call the clinic with any problems, questions or concerns. We can certainly see the patient much sooner if necessary.  I spent 25 minutes counseling the patient face to face. The total time spent in the appointment was 30 minutes.   She does have history of Crohn's disease and that has been doing quite well without any significant problems recently. She is followed by GI.  Amada Kingfisher, M.D. Oncology/Hematology [place] [phone] (Office)  05/09/2013

## 2013-06-03 ENCOUNTER — Other Ambulatory Visit: Payer: Self-pay

## 2013-07-14 ENCOUNTER — Other Ambulatory Visit: Payer: Self-pay | Admitting: Adult Health

## 2013-10-23 ENCOUNTER — Other Ambulatory Visit: Payer: Self-pay | Admitting: *Deleted

## 2013-10-23 DIAGNOSIS — C50919 Malignant neoplasm of unspecified site of unspecified female breast: Secondary | ICD-10-CM

## 2013-10-24 ENCOUNTER — Ambulatory Visit: Payer: Managed Care, Other (non HMO) | Admitting: Adult Health

## 2013-10-24 ENCOUNTER — Other Ambulatory Visit: Payer: Managed Care, Other (non HMO)

## 2014-01-30 ENCOUNTER — Other Ambulatory Visit: Payer: Self-pay | Admitting: Nurse Practitioner

## 2014-03-12 ENCOUNTER — Encounter: Payer: Self-pay | Admitting: Internal Medicine

## 2014-08-26 ENCOUNTER — Encounter: Payer: Self-pay | Admitting: Genetic Counselor

## 2015-03-11 ENCOUNTER — Ambulatory Visit: Payer: Managed Care, Other (non HMO) | Admitting: Cardiology

## 2015-04-09 ENCOUNTER — Encounter: Payer: Self-pay | Admitting: *Deleted

## 2015-04-09 ENCOUNTER — Ambulatory Visit: Payer: Managed Care, Other (non HMO) | Admitting: Internal Medicine

## 2015-04-09 ENCOUNTER — Other Ambulatory Visit: Payer: Self-pay | Admitting: *Deleted

## 2015-04-09 DIAGNOSIS — R635 Abnormal weight gain: Secondary | ICD-10-CM | POA: Insufficient documentation

## 2015-04-09 DIAGNOSIS — M545 Low back pain, unspecified: Secondary | ICD-10-CM | POA: Insufficient documentation

## 2015-04-09 DIAGNOSIS — E538 Deficiency of other specified B group vitamins: Secondary | ICD-10-CM | POA: Insufficient documentation

## 2015-04-09 DIAGNOSIS — Z0000 Encounter for general adult medical examination without abnormal findings: Secondary | ICD-10-CM | POA: Insufficient documentation

## 2015-04-09 DIAGNOSIS — D229 Melanocytic nevi, unspecified: Secondary | ICD-10-CM | POA: Insufficient documentation

## 2015-04-09 DIAGNOSIS — N390 Urinary tract infection, site not specified: Secondary | ICD-10-CM | POA: Insufficient documentation

## 2015-04-09 DIAGNOSIS — R109 Unspecified abdominal pain: Secondary | ICD-10-CM | POA: Insufficient documentation

## 2015-04-09 DIAGNOSIS — M549 Dorsalgia, unspecified: Secondary | ICD-10-CM | POA: Insufficient documentation

## 2015-04-09 DIAGNOSIS — K219 Gastro-esophageal reflux disease without esophagitis: Secondary | ICD-10-CM | POA: Insufficient documentation

## 2015-04-09 DIAGNOSIS — E876 Hypokalemia: Secondary | ICD-10-CM | POA: Insufficient documentation

## 2015-04-09 DIAGNOSIS — G43909 Migraine, unspecified, not intractable, without status migrainosus: Secondary | ICD-10-CM | POA: Insufficient documentation

## 2015-04-09 DIAGNOSIS — R399 Unspecified symptoms and signs involving the genitourinary system: Secondary | ICD-10-CM | POA: Insufficient documentation

## 2015-04-09 DIAGNOSIS — E039 Hypothyroidism, unspecified: Secondary | ICD-10-CM | POA: Insufficient documentation

## 2015-04-09 DIAGNOSIS — T148XXA Other injury of unspecified body region, initial encounter: Secondary | ICD-10-CM | POA: Insufficient documentation

## 2015-04-09 DIAGNOSIS — M791 Myalgia, unspecified site: Secondary | ICD-10-CM | POA: Insufficient documentation

## 2015-04-09 DIAGNOSIS — R0602 Shortness of breath: Secondary | ICD-10-CM | POA: Insufficient documentation

## 2015-04-09 DIAGNOSIS — R072 Precordial pain: Secondary | ICD-10-CM | POA: Insufficient documentation

## 2015-04-09 DIAGNOSIS — F419 Anxiety disorder, unspecified: Secondary | ICD-10-CM | POA: Insufficient documentation

## 2015-04-09 DIAGNOSIS — M2550 Pain in unspecified joint: Secondary | ICD-10-CM | POA: Insufficient documentation

## 2015-04-09 DIAGNOSIS — Z23 Encounter for immunization: Secondary | ICD-10-CM | POA: Insufficient documentation

## 2015-04-09 DIAGNOSIS — K121 Other forms of stomatitis: Secondary | ICD-10-CM | POA: Insufficient documentation

## 2015-04-09 DIAGNOSIS — R5383 Other fatigue: Secondary | ICD-10-CM | POA: Insufficient documentation

## 2015-04-09 DIAGNOSIS — R079 Chest pain, unspecified: Secondary | ICD-10-CM | POA: Insufficient documentation

## 2015-04-09 DIAGNOSIS — M25569 Pain in unspecified knee: Secondary | ICD-10-CM | POA: Insufficient documentation

## 2015-04-09 DIAGNOSIS — M62830 Muscle spasm of back: Secondary | ICD-10-CM | POA: Insufficient documentation

## 2015-04-09 DIAGNOSIS — F900 Attention-deficit hyperactivity disorder, predominantly inattentive type: Secondary | ICD-10-CM | POA: Insufficient documentation

## 2015-05-11 ENCOUNTER — Ambulatory Visit: Payer: Managed Care, Other (non HMO) | Admitting: Internal Medicine
